# Patient Record
Sex: Female | Born: 1965 | Race: Black or African American | Hispanic: No | Marital: Single | Smoking: Never smoker
Health system: Southern US, Community
[De-identification: ages and names within clinical notes are randomized; demographics above are authoritative.]

## PROBLEM LIST (undated history)

## (undated) DIAGNOSIS — I10 Essential (primary) hypertension: Secondary | ICD-10-CM

## (undated) DIAGNOSIS — L659 Nonscarring hair loss, unspecified: Secondary | ICD-10-CM

## (undated) DIAGNOSIS — E119 Type 2 diabetes mellitus without complications: Secondary | ICD-10-CM

## (undated) DIAGNOSIS — D649 Anemia, unspecified: Secondary | ICD-10-CM

## (undated) DIAGNOSIS — F32A Depression, unspecified: Secondary | ICD-10-CM

## (undated) DIAGNOSIS — F329 Major depressive disorder, single episode, unspecified: Secondary | ICD-10-CM

## (undated) DIAGNOSIS — R000 Tachycardia, unspecified: Secondary | ICD-10-CM

## (undated) DIAGNOSIS — D126 Benign neoplasm of colon, unspecified: Secondary | ICD-10-CM

## (undated) HISTORY — DX: Nonscarring hair loss, unspecified: L65.9

## (undated) HISTORY — DX: Anemia, unspecified: D64.9

## (undated) HISTORY — DX: Depression, unspecified: F32.A

## (undated) HISTORY — DX: Type 2 diabetes mellitus without complications: E11.9

## (undated) HISTORY — DX: Essential (primary) hypertension: I10

## (undated) HISTORY — DX: Major depressive disorder, single episode, unspecified: F32.9

## (undated) HISTORY — PX: OTHER SURGICAL HISTORY: SHX169

## (undated) HISTORY — DX: Benign neoplasm of colon, unspecified: D12.6

## (undated) HISTORY — DX: Tachycardia, unspecified: R00.0

---

## 2000-10-29 ENCOUNTER — Emergency Department (HOSPITAL_COMMUNITY): Admission: EM | Admit: 2000-10-29 | Discharge: 2000-10-29 | Payer: Self-pay | Admitting: Emergency Medicine

## 2000-10-30 ENCOUNTER — Encounter: Payer: Self-pay | Admitting: Emergency Medicine

## 2001-11-08 ENCOUNTER — Encounter: Payer: Self-pay | Admitting: Obstetrics & Gynecology

## 2001-11-08 ENCOUNTER — Ambulatory Visit (HOSPITAL_COMMUNITY): Admission: RE | Admit: 2001-11-08 | Discharge: 2001-11-08 | Payer: Self-pay | Admitting: Obstetrics & Gynecology

## 2001-11-14 ENCOUNTER — Other Ambulatory Visit: Admission: RE | Admit: 2001-11-14 | Discharge: 2001-11-14 | Payer: Self-pay | Admitting: Obstetrics

## 2002-05-13 ENCOUNTER — Emergency Department (HOSPITAL_COMMUNITY): Admission: EM | Admit: 2002-05-13 | Discharge: 2002-05-14 | Payer: Self-pay | Admitting: Emergency Medicine

## 2003-06-27 ENCOUNTER — Emergency Department (HOSPITAL_COMMUNITY): Admission: EM | Admit: 2003-06-27 | Discharge: 2003-06-27 | Payer: Self-pay | Admitting: Emergency Medicine

## 2004-01-23 ENCOUNTER — Ambulatory Visit: Payer: Self-pay | Admitting: Psychiatry

## 2004-01-23 ENCOUNTER — Inpatient Hospital Stay (HOSPITAL_COMMUNITY): Admission: AD | Admit: 2004-01-23 | Discharge: 2004-01-29 | Payer: Self-pay | Admitting: Psychiatry

## 2005-06-25 ENCOUNTER — Emergency Department (HOSPITAL_COMMUNITY): Admission: EM | Admit: 2005-06-25 | Discharge: 2005-06-25 | Payer: Self-pay | Admitting: Emergency Medicine

## 2005-08-11 ENCOUNTER — Encounter: Admission: RE | Admit: 2005-08-11 | Discharge: 2005-09-16 | Payer: Self-pay | Admitting: Orthopedic Surgery

## 2005-09-19 ENCOUNTER — Ambulatory Visit: Payer: Self-pay | Admitting: Internal Medicine

## 2005-09-30 ENCOUNTER — Ambulatory Visit: Payer: Self-pay | Admitting: Internal Medicine

## 2005-10-07 ENCOUNTER — Ambulatory Visit: Payer: Self-pay | Admitting: Internal Medicine

## 2005-10-12 ENCOUNTER — Ambulatory Visit (HOSPITAL_COMMUNITY): Admission: RE | Admit: 2005-10-12 | Discharge: 2005-10-12 | Payer: Self-pay | Admitting: Internal Medicine

## 2005-12-13 ENCOUNTER — Ambulatory Visit: Payer: Self-pay | Admitting: Psychiatry

## 2005-12-13 ENCOUNTER — Inpatient Hospital Stay (HOSPITAL_COMMUNITY): Admission: AD | Admit: 2005-12-13 | Discharge: 2005-12-21 | Payer: Self-pay | Admitting: Psychiatry

## 2005-12-14 ENCOUNTER — Encounter: Payer: Self-pay | Admitting: Psychiatry

## 2006-05-03 ENCOUNTER — Ambulatory Visit: Payer: Self-pay | Admitting: Internal Medicine

## 2006-05-05 ENCOUNTER — Ambulatory Visit: Payer: Self-pay | Admitting: Internal Medicine

## 2006-05-11 ENCOUNTER — Ambulatory Visit: Payer: Self-pay | Admitting: *Deleted

## 2006-06-16 ENCOUNTER — Ambulatory Visit: Payer: Self-pay | Admitting: Internal Medicine

## 2006-06-29 ENCOUNTER — Ambulatory Visit: Payer: Self-pay | Admitting: Internal Medicine

## 2006-08-03 ENCOUNTER — Ambulatory Visit: Payer: Self-pay | Admitting: Internal Medicine

## 2007-01-24 ENCOUNTER — Encounter (INDEPENDENT_AMBULATORY_CARE_PROVIDER_SITE_OTHER): Payer: Self-pay | Admitting: *Deleted

## 2010-05-09 DIAGNOSIS — R000 Tachycardia, unspecified: Secondary | ICD-10-CM

## 2010-05-09 HISTORY — DX: Tachycardia, unspecified: R00.0

## 2010-09-24 NOTE — H&P (Signed)
NAMEPANDA, CROSSIN               ACCOUNT NO.:  [id number]   MEDICAL RECORD NO.:  [id number]          PATIENT TYPE:  IPS   LOCATION:  0504                          FACILITY:  [facility]   PHYSICIAN:  Geoffery Lyons, M.D.      DATE OF BIRTH:  08-Aug-1965   DATE OF ADMISSION:  01/23/2004  DATE OF DISCHARGE:                         PSYCHIATRIC ADMISSION ASSESSMENT   IDENTIFYING INFORMATION:  This is an involuntary admission.  This is a 45-  year-old single African-American female.  She apparently attempted suicide  by overdosing on several medications, Lexapro 1 pill, trazodone 5, Risperdal  2 pills.  She wanted to sleep and not wake up.  She reports she is  depressed, she cannot handle stress.  She is known to hit herself.  She has  done this in the past in the head with a hammer.  She reports that her sleep  is poor and her appetite is poor.  She is having trouble with her memory.  She currently denies any suicidal or homicidal ideation and no auditory or  visual hallucinations.  She is known to have mild MR.  She has had one prior  admission in 2004.   PAST PSYCHIATRIC HISTORY:  As already stated, she has had one prior  admission in 2004.  She is currently under the care of Dr. Vilinda Blanks at  [facility].   SOCIAL HISTORY:  She states she graduated high school in 1985.  She was  employed at [employer] for 4 years.  She has never married.  She has  no children.  She is currently living alone.  She is applying for  disability.  She has no income at the moment.   FAMILY HISTORY:  She denies.   ALCOHOL AND DRUG ABUSE:  She denies.   PAST MEDICAL HISTORY:  Primary care Gaylen Venning:  She has none at the moment.  It used to be Dr. Ronne Binning.  He treated her for hypertension but he is no  longer her physician.   CURRENT MEDICATIONS:  Norvasc 10 mg p.o. daily, Lexapro 10 mg daily, and  Seroquel 600 mg at h.s.   ALLERGIES:  No known drug allergies.   POSITIVE PHYSICAL  FINDINGS:  PHYSICAL EXAMINATION:  The patient is obese.  She has hirsutism.  Otherwise her physical examination was within normal  limits.   MENTAL STATUS EXAM:  She is alert and oriented x2.  She got the year wrong  twice.  First she thought it was 43, then 81.  I told her no, it is  32.  Appearance and behavior:  She has a wig on.  She has lots of facial  hair and other than that her appearance and behavior were within normal  limits.  Specifically, she is not neurovegetative.  She has a speech  impediment, otherwise her speech was normal in rate, rhythm and tone.  Her  mood is somewhat depressed.  Her affect is worried.  Her thought processes  are clear and relevant, goal oriented.  She would like help.  She did not  have any delusions or paranoia apparent  in her interview.  Concentration and  memory are fair, judgment and insight are fair.  Intelligence is average to  below.  She denies suicidal or homicidal ideation and she denies auditory or  visual hallucinations.   ADMISSION DIAGNOSES:   AXIS I:  Major depressive disorder, recurrent, without psychotic features.   AXIS II:  Deferred.   AXIS III:  She is known to have a cyst on her ovaries.  She has been treated  for fibroids in the past, asthma.  Her admission lab work indicated anemia  and hyperglycemia.  She probably has metabolic syndrome.   AXIS V:  Global assessment of function is 30.   PLAN:  The plan is to admit to provide stabilization and safety.  We will  address her psychotropics for appropriate dosages and types.  We will  consult with [facility] as she is unassigned regarding her  various medical illness.      MD/MEDQ  D:  01/24/2004  T:  01/25/2004  Job:  [id number]

## 2010-09-24 NOTE — Discharge Summary (Signed)
Lauren Mccormick, Lauren Mccormick               ACCOUNT NO.:  [id number]   MEDICAL RECORD NO.:  [id number]          PATIENT TYPE:  IPS   LOCATION:  0504                          FACILITY:  [facility]   PHYSICIAN:  Geoffery Lyons, M.D.      DATE OF BIRTH:  [birth year]   DATE OF ADMISSION:  01/23/2004  DATE OF DISCHARGE:  01/29/2004                                 DISCHARGE SUMMARY   CHIEF COMPLAINT AND PRESENT ILLNESS:  It was the first admission to [facility] for this 45 year old, single, African-American  female, attempted suicide by overdosing on several medications - Lexapro,  trazodone and Risperdal.  She claims she wanted to sleep and not wake up.  Reported that she was depressed, can not handle stress.  Known to hit  herself.  She has done this in the past in the head with a hammer.  She  reported that her sleep was poor and her appetite was poor.  Trouble with  her memory.  Currently denied any suicidal or homicidal ideation.  No  auditory, visual hallucinations.  She actually had one prior admission in  2004.   PAST PSYCHIATRIC HISTORY:  Currently under the care of Dr. __________ at  [facility].   ALCOHOL/DRUG HISTORY:  Denies the use or abuse of any substance.   PAST MEDICAL HISTORY:  Has been treated for hypertension.   MEDICATIONS:  1.  Norvasc 10 mg per day.  2.  Lexapro 10 mg per day.  3.  Seroquel 600 at bedtime.   PHYSICAL EXAMINATION:  Performed and failed to show any acute findings.   LABORATORY WORKUP:  CBC within normal limits, except hemoglobin 10.4.  Hemoglobin A1c is 5.7.  TSH 1.041.  Blood chemistries within normal limits.  Drug screen negative for substances of abuse.   MENTAL STATUS EXAM:  Reveals an alert, cooperative female.  She has a speech  impediment, otherwise her speech was normal rate, rhythm and tone.  Mood was  somewhat depressed.  Affect was worried.  Thought processes were clear,  relevant, goal-oriented.  She would  like help.  Did not have any delusions  or paranoia apparent in the interview.  Cognition was well-preserved.  Intellectual abilities seemed to be average to below average.   ADMISSION DIAGNOSES:   AXIS I:  Major depression, recurrent.   AXIS II:  No diagnosis.   AXIS III:  1.  Anemia.  2.  Ovarian cysts.  3.  Hypertension.   AXIS IV:  Moderate.   AXIS V:  Global Assessment of Functioning upon admission 30; highest Global  Assessment of Functioning in the last year 60.   COURSE IN HOSPITAL:  She was admitted and started in individual and group  psychotherapy.  She was given Ambien for sleep.  She was maintained on the  Norvasc 10 mg per day, Lexapro 10 mg per day and Seroquel 600 at bedtime.  Lexapro was increased to 15 and she was placed on Abilify 5 mg per day.  Initially very depressed, isolated, felt under a lot of stress, especially  financial, some  underlying paranoia and difficulty with her mood, her mood  having been very depressed with suicide thoughts.  Initially on Prozac;  lately on Lexapro.  She endorsed difficulty with paranoia as well as  multiple stressors.  Continued to endorse that she was feeling very  depressed.  Not sleeping too well.  We went ahead and increased her Seroquel  to 700.  There are concerns having to do with her financial situation,  trying to get disability.  Endorsed some voices inside her head.  By  September 21st, she endorsed she was starting to feel better.  Felt like her  thinking was better.  Still very anxious, having a hard time talking in  groups.  Keeping to herself.  Very anxious about being in group, but  challenging herself to try to do better.  Objectively, she was a little  brighter, but she was denying any suicide, any homicide ideations.  On  September 22nd, she was feeling like she was ready to go home and there were  no suicide ideas, no homicide ideas.  Anxious about getting out, but felt  strongly that she could do it.   Feeling that she needed to get a job, but at  the same time not sure she was going to be able to do it.  The family was  supportive and she was at this time committed to maintain outpatient  treatment.  Upon discharge, in full contact with reality.  No suicide ideas,  no homicide ideas, no hallucinations, no delusions.  __________ to pursue  further treatment.   DISCHARGE DIAGNOSES:   AXIS I:  1.  Major depression with psychotic features.  2.  Anxiety, not otherwise specified.   AXIS II:  No diagnosis.   AXIS III:  1.  Anemia.  2.  Hypertension.  3.  Ovarian cysts.   AXIS IV:  Moderate.   AXIS V:  Global Assessment of Functioning upon discharge 50.   DISCHARGE MEDICATIONS:  1.  Norvasc 10 mg per day.  2.  Abilify 5 mg one in the morning.  3.  Lexapro 15 mg per day.  4.  Iron, ferrous sulfate 325 one twice a day.  5.  Seroquel 800 at bedtime.   FOLLOW UP:  At [facility].     Farrel Gordon   IL/MEDQ  D:  02/18/2004  T:  02/19/2004  Job:  [id number]

## 2013-05-22 ENCOUNTER — Encounter: Payer: Self-pay | Admitting: Physician Assistant

## 2013-05-27 ENCOUNTER — Ambulatory Visit: Payer: Self-pay | Admitting: Physician Assistant

## 2013-06-13 ENCOUNTER — Ambulatory Visit: Payer: Medicare HMO

## 2013-06-13 ENCOUNTER — Ambulatory Visit (INDEPENDENT_AMBULATORY_CARE_PROVIDER_SITE_OTHER): Payer: Medicare HMO | Admitting: Family Medicine

## 2013-06-13 VITALS — BP 142/86 | HR 92 | Temp 98.0°F | Resp 16 | Ht 62.0 in | Wt 257.0 lb

## 2013-06-13 DIAGNOSIS — R059 Cough, unspecified: Secondary | ICD-10-CM

## 2013-06-13 DIAGNOSIS — R05 Cough: Secondary | ICD-10-CM

## 2013-06-13 DIAGNOSIS — R5381 Other malaise: Secondary | ICD-10-CM

## 2013-06-13 DIAGNOSIS — R0602 Shortness of breath: Secondary | ICD-10-CM

## 2013-06-13 DIAGNOSIS — Z8679 Personal history of other diseases of the circulatory system: Secondary | ICD-10-CM

## 2013-06-13 DIAGNOSIS — R350 Frequency of micturition: Secondary | ICD-10-CM

## 2013-06-13 DIAGNOSIS — R5383 Other fatigue: Secondary | ICD-10-CM

## 2013-06-13 DIAGNOSIS — E1149 Type 2 diabetes mellitus with other diabetic neurological complication: Secondary | ICD-10-CM

## 2013-06-13 DIAGNOSIS — I10 Essential (primary) hypertension: Secondary | ICD-10-CM

## 2013-06-13 LAB — POCT URINALYSIS DIPSTICK
Bilirubin, UA: NEGATIVE
Blood, UA: NEGATIVE
Glucose, UA: NEGATIVE
Ketones, UA: NEGATIVE
Nitrite, UA: NEGATIVE
Protein, UA: NEGATIVE
Spec Grav, UA: 1.015
Urobilinogen, UA: 0.2
pH, UA: 7

## 2013-06-13 LAB — POCT CBC
Granulocyte percent: 62.5 %G (ref 37–80)
HCT, POC: 36.6 % — AB (ref 37.7–47.9)
Hemoglobin: 11.3 g/dL — AB (ref 12.2–16.2)
Lymph, poc: 2.6 (ref 0.6–3.4)
MCH, POC: 27.4 pg (ref 27–31.2)
MCHC: 30.9 g/dL — AB (ref 31.8–35.4)
MCV: 88.9 fL (ref 80–97)
MID (cbc): 0.4 (ref 0–0.9)
MPV: 8 fL (ref 0–99.8)
POC Granulocyte: 5.1 (ref 2–6.9)
POC LYMPH PERCENT: 32.5 %L (ref 10–50)
POC MID %: 5 %M (ref 0–12)
Platelet Count, POC: 340 10*3/uL (ref 142–424)
RBC: 4.12 M/uL (ref 4.04–5.48)
RDW, POC: 14.9 %
WBC: 8.1 10*3/uL (ref 4.6–10.2)

## 2013-06-13 LAB — POCT UA - MICROSCOPIC ONLY
Casts, Ur, LPF, POC: NEGATIVE
Crystals, Ur, HPF, POC: NEGATIVE
Mucus, UA: NEGATIVE
Yeast, UA: NEGATIVE

## 2013-06-13 LAB — POCT GLYCOSYLATED HEMOGLOBIN (HGB A1C): Hemoglobin A1C: 5.9

## 2013-06-13 NOTE — Patient Instructions (Signed)
Cough, Adult  A cough is a reflex that helps clear your throat and airways. It can help heal the body or may be a reaction to an irritated airway. A cough may only last 2 or 3 weeks (acute) or may last more than 8 weeks (chronic).  CAUSES Acute cough:  Viral or bacterial infections. Chronic cough:  Infections.  Allergies.  Asthma.  Post-nasal drip.  Smoking.  Heartburn or acid reflux.  Some medicines.  Chronic lung problems (COPD).  Cancer. SYMPTOMS   Cough.  Fever.  Chest pain.  Increased breathing rate.  High-pitched whistling sound when breathing (wheezing).  Colored mucus that you cough up (sputum). TREATMENT   A bacterial cough may be treated with antibiotic medicine.  A viral cough must run its course and will not respond to antibiotics.  Your caregiver may recommend other treatments if you have a chronic cough. HOME CARE INSTRUCTIONS   Only take over-the-counter or prescription medicines for pain, discomfort, or fever as directed by your caregiver. Use cough suppressants only as directed by your caregiver.  Use a cold steam vaporizer or humidifier in your bedroom or home to help loosen secretions.  Sleep in a semi-upright position if your cough is worse at night.  Rest as needed.  Stop smoking if you smoke. SEEK IMMEDIATE MEDICAL CARE IF:   You have pus in your sputum.  Your cough starts to worsen.  You cannot control your cough with suppressants and are losing sleep.  You begin coughing up blood.  You have difficulty breathing.  You develop pain which is getting worse or is uncontrolled with medicine.  You have a fever. MAKE SURE YOU:   Understand these instructions.  Will watch your condition.  Will get help right away if you are not doing well or get worse. Document Released: 10/22/2010 Document Revised: 07/18/2011 Document Reviewed: 10/22/2010 [facility] Patient Information 2014 [place]. Shortness of Breath Shortness of  breath means you have trouble breathing. Shortness of breath needs medical care right away. HOME CARE   Do not smoke.  Avoid being around chemicals or things (paint fumes, dust) that may bother your breathing.  Rest as needed. Slowly begin your normal activities.  Only take medicines as told by your doctor.  Keep all doctor visits as told. GET HELP RIGHT AWAY IF:   Your shortness of breath gets worse.  You feel lightheaded, pass out (faint), or have a cough that is not helped by medicine.  You cough up blood.  You have pain with breathing.  You have pain in your chest, arms, shoulders, or belly (abdomen).  You have a fever.  You cannot walk up stairs or exercise the way you normally do.  You do not get better in the time expected.  You have a hard time doing normal activities even with rest.  You have problems with your medicines.  You have any new symptoms. MAKE SURE YOU:  Understand these instructions.  Will watch your condition.  Will get help right away if you are not doing well or get worse. Document Released: 10/12/2007 Document Revised: 10/25/2011 Document Reviewed: 07/11/2011 [facility] Patient Information 2014 [place], [place].

## 2013-06-13 NOTE — Progress Notes (Signed)
Chief Complaint:  Chief Complaint  Patient presents with  . Shortness of Breath    X "a very long time"  . Cough    X 2 years, says at night  . Chest Congestion    X 2 years, says at night    HPI: Lauren Mccormick is a 48 y.o. female who is here for a 2 year history of  " has been suffering from weakness and SOB" when she gets up in Am and bending she is constantly out of breath, experiences this more at at night when she has coughign and mucus from her chest. She feels tired and weak when she is bending. She gets "breathy". Again this is mores so with bending rather than exertion. Symptoms have been present for 2-3 years. Has seen a previosu doctor in  and was found to have an enlarged heart, ran tests on her (EKG, Echocradiogram, and saw a cardiologist in 2010 and did not find anything at the time . Denies seasonal allergies or asthma. Denies CHF.   She weighed 280 lbs but since she has been down here she has lost weight so she does not think SOB is related to her weight. She has minimal LE edema, uses 2 pillows, denies daytime sleepiness or apnea. She does not know if she snores. She has never been tested fro OSA. She is not SOB when she walks across the room.   She has diabetes but has not been checked since moved in [place] and has seen  2 doctors. Too long of a stay so she left. She was checked for diabetes 1 month. She was checked for DM  Has tried otc nasal spray without releif gor allergy sxs  Past Medical History  Diagnosis Date  . Anemia   . Depression   . Diabetes mellitus without complication   . Hypertension    History reviewed. No pertinent past surgical history. History   Social History  . Marital Status: Single    Spouse Name: N/A    Number of Children: N/A  . Years of Education: N/A   Social History Main Topics  . Smoking status: Never Smoker   . Smokeless tobacco: None  . Alcohol Use: No  . Drug Use: No  . Sexual Activity: None   Other  Topics Concern  . None   Social History Narrative  . None   Family History  Problem Relation Age of Onset  . Hypertension Mother   . Heart disease Father   . Hypertension Father   . Stroke Father    Allergies no known allergies Prior to Admission medications   Medication Sig Start Date End Date Taking? Authorizing Provider  escitalopram (LEXAPRO) 20 MG tablet Take 20 mg by mouth daily.   Yes Historical Provider, MD  famotidine (PEPCID) 20 MG tablet Take 20 mg by mouth 2 (two) times daily.   Yes Historical Provider, MD  gabapentin (NEURONTIN) 300 MG capsule Take 300 mg by mouth 3 (three) times daily.   Yes Historical Provider, MD  lisinopril (PRINIVIL,ZESTRIL) 20 MG tablet Take 20 mg by mouth daily.   Yes Historical Provider, MD  sitaGLIPtin (JANUVIA) 100 MG tablet Take 100 mg by mouth daily.   Yes Historical Provider, MD     ROS: The patient denies fevers, chills, night sweats, unintentional weight loss, chest pain, palpitations, wheezing, nausea, vomiting, abdominal pain, dysuria, hematuria, melena, numbness, weakness, or tingling.   All other systems have been reviewed and  were otherwise negative with the exception of those mentioned in the HPI and as above.    PHYSICAL EXAM: Filed Vitals:   06/13/13 1715  BP: 142/86  Pulse: 92  Temp: 98 F (36.7 C)  Resp: 16  Spo2 100% Filed Vitals:   06/13/13 1715  Height: 5\' 2"  (1.575 m)  Weight: 257 lb (116.574 kg)   Body mass index is 46.99 kg/(m^2).  General: Alert, no acute distress, obese HEENT:  Normocephalic, atraumatic, oropharynx patent. EOMI, PERRLA Cardiovascular:  Regular rate and rhythm, no rubs murmurs or gallops.  No Carotid bruits, radial pulse intact. No pedal edema.  Respiratory: Clear to auscultation bilaterally.  No wheezes, rales, or rhonchi.  No cyanosis, no use of accessory musculature GI: No organomegaly, abdomen is soft and non-tender, positive bowel sounds.  No masses. Skin: No rashes. Neurologic:  Facial musculature symmetric. Psychiatric: Patient is appropriate throughout our interaction. Lymphatic: No cervical lymphadenopathy Musculoskeletal: Gait intact. NO CVA tenderness   LABS: Results for orders placed in visit on 06/13/13  POCT CBC      Result Value Range   WBC 8.1  4.6 - 10.2 K/uL   Lymph, poc 2.6  0.6 - 3.4   POC LYMPH PERCENT 32.5  10 - 50 %L   MID (cbc) 0.4  0 - 0.9   POC MID % 5.0  0 - 12 %M   POC Granulocyte 5.1  2 - 6.9   Granulocyte percent 62.5  37 - 80 %G   RBC 4.12  4.04 - 5.48 M/uL   Hemoglobin 11.3 (*) 12.2 - 16.2 g/dL   HCT, POC 36.6 (*) 37.7 - 47.9 %   MCV 88.9  80 - 97 fL   MCH, POC 27.4  27 - 31.2 pg   MCHC 30.9 (*) 31.8 - 35.4 g/dL   RDW, POC 14.9     Platelet Count, POC 340  142 - 424 K/uL   MPV 8.0  0 - 99.8 fL  POCT GLYCOSYLATED HEMOGLOBIN (HGB A1C)      Result Value Range   Hemoglobin A1C 5.9    POCT UA - MICROSCOPIC ONLY      Result Value Range   WBC, Ur, HPF, POC 8-12     RBC, urine, microscopic 0-2     Bacteria, U Microscopic 1+     Mucus, UA neg     Epithelial cells, urine per micros 2-4     Crystals, Ur, HPF, POC neg     Casts, Ur, LPF, POC neg     Yeast, UA neg    POCT URINALYSIS DIPSTICK      Result Value Range   Color, UA yellow     Clarity, UA clear     Glucose, UA neg     Bilirubin, UA neg     Ketones, UA neg     Spec Grav, UA 1.015     Blood, UA neg     pH, UA 7.0     Protein, UA neg     Urobilinogen, UA 0.2     Nitrite, UA neg     Leukocytes, UA small (1+)       EKG/XRAY:   Primary read interpreted by Dr. Marin Comment at [facility]. ? Right lower Infiltrate vs increase bronchial thickening, slightly more prominent than in 2007 xray comparison + cardiomegaly stable   ASSESSMENT/PLAN: Encounter Diagnoses  Name Primary?  . SOB (shortness of breath) Yes  . Cough   . Other malaise and fatigue   .  Type II or unspecified type diabetes mellitus with neurological manifestations, not stated as uncontrolled   . HTN  (hypertension)   . Increased urinary frequency   . History of cardiomegaly     Viral  URI is causing symptoms But because cardiomegaly history I am going to refer to cardiology before we make any decisions about referring to pulmonology Will need old records, those are pending asked pt to get them.  Will treat sxs for now but will make  F/u prn   Gross sideeffects, risk and benefits, and alternatives of medications d/w patient. Patient is aware that all medications have potential sideeffects and we are unable to predict every sideeffect or drug-drug interaction that may occur.  LE, [place], DO 06/13/2013 7:51 PM

## 2013-06-14 LAB — COMPREHENSIVE METABOLIC PANEL
ALT: 14 U/L (ref 0–35)
AST: 21 U/L (ref 0–37)
Albumin: 4.1 g/dL (ref 3.5–5.2)
Alkaline Phosphatase: 70 U/L (ref 39–117)
Calcium: 9.7 mg/dL (ref 8.4–10.5)
Chloride: 102 mEq/L (ref 96–112)
Glucose, Bld: 77 mg/dL (ref 70–99)
Potassium: 4 mEq/L (ref 3.5–5.3)
Sodium: 139 mEq/L (ref 135–145)
Total Bilirubin: 0.3 mg/dL (ref 0.2–1.2)
Total Protein: 7.8 g/dL (ref 6.0–8.3)

## 2013-06-14 LAB — COMPREHENSIVE METABOLIC PANEL WITH GFR
BUN: 5 mg/dL — ABNORMAL LOW (ref 6–23)
CO2: 28 meq/L (ref 19–32)
Creat: 0.69 mg/dL (ref 0.50–1.10)

## 2013-06-14 LAB — TSH: TSH: 1.161 u[IU]/mL (ref 0.350–4.500)

## 2013-06-17 ENCOUNTER — Telehealth: Payer: Self-pay

## 2013-06-17 NOTE — Telephone Encounter (Signed)
Please call patient ASAP. Patient needs to give important information to medical staff regarding her previous visits and medical records at an facility out of state.     Thank You!!!

## 2013-06-17 NOTE — Telephone Encounter (Signed)
Spoke with patient and she needed to give information so we could get her medical records. She said she filled out a record release when she was here.  [place] PH# [phone]

## 2013-06-18 NOTE — Telephone Encounter (Signed)
calleed [facility] and got fax number. Tried faxing release form but it failed. Form refaxed and waiting for confirmation.

## 2013-06-19 NOTE — Telephone Encounter (Signed)
Tried faxing records request several times and they all failed. Requested mailed this morning to [facility] at [address] [place] [address]

## 2013-06-25 ENCOUNTER — Institutional Professional Consult (permissible substitution): Payer: Self-pay | Admitting: Cardiovascular Disease

## 2013-06-28 ENCOUNTER — Ambulatory Visit: Payer: Self-pay | Admitting: Physician Assistant

## 2013-06-28 ENCOUNTER — Ambulatory Visit: Payer: Medicare HMO | Admitting: Family Medicine

## 2013-07-04 ENCOUNTER — Institutional Professional Consult (permissible substitution): Payer: Self-pay | Admitting: Cardiovascular Disease

## 2013-07-10 ENCOUNTER — Institutional Professional Consult (permissible substitution): Payer: Self-pay | Admitting: Cardiovascular Disease

## 2013-08-12 ENCOUNTER — Encounter: Payer: Self-pay | Admitting: Cardiovascular Disease

## 2013-08-12 ENCOUNTER — Ambulatory Visit (INDEPENDENT_AMBULATORY_CARE_PROVIDER_SITE_OTHER): Payer: Commercial Managed Care - HMO | Admitting: Cardiovascular Disease

## 2013-08-12 VITALS — BP 138/90 | HR 104 | Ht 62.0 in | Wt 252.4 lb

## 2013-08-12 DIAGNOSIS — R0989 Other specified symptoms and signs involving the circulatory and respiratory systems: Secondary | ICD-10-CM

## 2013-08-12 DIAGNOSIS — R0600 Dyspnea, unspecified: Secondary | ICD-10-CM | POA: Insufficient documentation

## 2013-08-12 DIAGNOSIS — R0609 Other forms of dyspnea: Secondary | ICD-10-CM

## 2013-08-12 MED ORDER — CARVEDILOL 6.25 MG PO TABS: 6.2500 mg | ORAL_TABLET | Freq: Two times a day (BID) | ORAL | Status: DC

## 2013-08-12 NOTE — Progress Notes (Signed)
     Lauren Mccormick Date of Birth  Oct 07, 1965       [facility]    [employer] [address], [place], [place]  [address]   [place], [place]  [address] [place]   Fax  [phone]     Fax [phone]  Problem List: 1. Hypertension 2. Diabetes mellitus 3. Dyspnea  History of Present Illness:  Lauren Mccormick is a 48 yo who has dyspnea - especially when bending over.  No CP.  She is short of breath at rest.  She does not get any exercise.  She complains of generalized weakness.   She is on disability.    She has been found to have cardiomegaly.   Current Outpatient Prescriptions on File Prior to Visit  Medication Sig Dispense Refill  . escitalopram (LEXAPRO) 20 MG tablet Take 20 mg by mouth daily.      . famotidine (PEPCID) 20 MG tablet Take 20 mg by mouth 2 (two) times daily.      Marland Kitchen gabapentin (NEURONTIN) 300 MG capsule Take 300 mg by mouth 3 (three) times daily.      Marland Kitchen lisinopril (PRINIVIL,ZESTRIL) 20 MG tablet Take 20 mg by mouth daily.      . sitaGLIPtin (JANUVIA) 100 MG tablet Take 100 mg by mouth daily.       No current facility-administered medications on file prior to visit.    No Known Allergies  Past Medical History  Diagnosis Date  . Anemia   . Depression   . Diabetes mellitus without complication   . Hypertension     No past surgical history on file.  History  Smoking status  . Never Smoker   Smokeless tobacco  . Not on file    History  Alcohol Use No    Family History  Problem Relation Age of Onset  . Hypertension Mother   . Heart disease Father   . Hypertension Father   . Stroke Father     Reviw of Systems:  Reviewed in the HPI.  All other systems are negative.  Physical Exam: Blood pressure 138/90, pulse 104, height 5\' 2"  (1.575 m), weight 252 lb 6.4 oz (114.488 kg). Wt Readings from Last 3 Encounters:  08/12/13 252 lb 6.4 oz (114.488 kg)  06/13/13 257 lb (116.574 kg)       General: Well developed, well nourished, in no acute distress.  Head: Normocephalic, atraumatic, sclera non-icteric, mucus membranes are moist,   Neck: Supple. Carotids are 2 + without bruits. No JVD   Lungs: Clear   Heart: RR, normal S1-S2  Abdomen: Soft, non-tender, non-distended with normal bowel sounds.  Msk:  Strength and tone are normal   Extremities: No clubbing or cyanosis. No edema.  Distal pedal pulses are 2+ and equal    Neuro: CN II - XII intact.  Alert and oriented X 3.   Psych:  Normal   ECG: Feb. 2015:  NSR. Normal ECG  Assessment / Plan:

## 2013-08-12 NOTE — Patient Instructions (Addendum)
Your physician has requested that you have an echocardiogram. Echocardiography is a painless test that uses sound waves to create images of your heart. It provides your doctor with information about the size and shape of your heart and how well your heart's chambers and valves are working. This procedure takes approximately one hour. There are no restrictions for this procedure.  Your physician has recommended you make the following change in your medication:  START CARVEDILOL/ COREG 6.25 MG TWICE DAILY 12 HOURS APART  Your physician recommends that you schedule a follow-up appointment in: 3 MONTHS   REDUCE [place], GRAVY, SAUCES, READY PREPARED FOODS [place]; LEAN CUISINE, LASAGNA. BACON, SAUSAGE, LUNCH MEAT, FAST FOODS, HOT DOGS, CHIPS, PIZZA, CHINESE FOOD, MEXICAN FOOD, SOY SAUCE, STORE BOUGHT FRIED CHICKEN= [place] FRIED CHICKEN/ BO JANGLES.  PICKLES, OLIVES, KETCHUP   [facility] Low Glycemic Diet (Source: [facility], 2006) Low Glycemic Foods (20-49) (Decrease risk of developing heart disease) Breakfast Cereals: All-Bran All-Bran Fruit 'n Oats Fiber One Oatmeal (not instant) Oat bran Fruits and fruit juices: (Limit to 1-2 servings per day) Apples Apricots (fresh & dried) Blackberries Blueberries Cherries Cranberries Peaches Pears Plums Prunes Grapefruit Raspberries Strawberries Tangerine Apple juice Grapefruit juice Tomato juice Beans and legumes (fresh-cooked): Black-eyed peas Butter beans Chick peas Lentils  Green beans Lima beans Kidney beans Navy beans Pinto beans Snow peas Non-starchy vegetables: Asparagus, avocado, broccoli, cabbage, cauliflower, celery, cucumber, greens, lettuce, mushrooms, peppers, tomatoes, okra, onions, spinach, summer squash Grains: Barley Bulgur Rye Wild rice Nuts and oils : Almonds Peanuts Sunflower seeds Hazelnuts Pecans Walnuts Oils that are liquid at room temperature Dairy,  fish, meat, soy, and eggs: Milk, skim Lowfat cheese Yogurt, lowfat, fruit sugar sweetened Lean red meat Fish  Skinless chicken & [place] Shellfish Egg whites (up to 3 daily) Soy products  Egg yolks (up to 7 or _____ per week) Moderate Glycemic Foods (50-69) Breakfast Cereals: Bran Buds Bran Chex Just Right Mini-Wheats  Special K Swiss muesli Fruits: Banana (under-ripe) Dates Figs Grapes Kiwi Mango Oranges Raisins Fruit Juices: Cranberry juice Orange juice Beans and legumes: [place]-type baked beans Canned pinto, kidney, or navy beans Green peas Vegetables: Beets Carrots  Sweet potato Yam Corn on the cob Breads: Pita (pocket) bread Oat bran bread Pumpernickel bread Rye bread Wheat bread, high fiber  Grains: Cornmeal Rice, brown Rice, white Couscous Pasta: Macaroni Pizza, cheese Ravioli, meat filled Spaghetti, white  Nuts: Cashews Macadamia Snacks: Chocolate Ice cream, lowfat Muffin Popcorn High Glycemic Foods (70-100)  Breakfast Cereals: Cheerios Corn Chex Corn Flakes Cream of Wheat Grape Nuts Grape Nut Flakes Grits Nutri-Grain Puffed Rice Puffed Wheat Rice Chex Rice Krispies Shredded Wheat Team Total Fruits: Pineapple Watermelon Banana (over-ripe) Beverages: Sodas, sweet tea, pineapple juice Vegetables: Potato, baked, boiled, fried, mashed [place] fries Canned or frozen corn Parsnips Winter squash Breads: Most breads (white and whole grain) Bagels Bread sticks Bread stuffing Kaiser roll Dinner rolls Grains: Rice, instant Tapioca, with milk Candy and most cookies Snacks: Donuts Corn chips Jelly beans Pretzels Pastries

## 2013-08-12 NOTE — Assessment & Plan Note (Signed)
We had long discussion about her morbid obesity. She definitely needs to lose weight. I suspect that this is the main cause of her shortness breath. It is definitely the cause of her shortness breath when she bends over.  We discussed various strategies of weight loss including going to Weight Watchers and carbohydrate reduction. I've given her the due to low glycemic index diet. She'll followup with her medical Dr. for further weight loss recommendations.

## 2013-08-12 NOTE — Assessment & Plan Note (Signed)
Lauren Mccormick has  had shortness of breath for years. She's been seen by several cardiologists in her previous home state of [place].  She's been completely evaluated noninvasively and her heart as always checked out okay. She told that she has had echocardiograms and stress test. She does not recall her having a cardiac cath.  She presents with shortness breath and weakness. She's particularly short of breath when she bends over. She also notes profound weakness whenever she tries to walk anywhere.  I suspect her shortness of breath is largely due to her obesity. We had long discussion about weight reduction strategies. I've given her a low carbohydrate diet. I've encouraged her to join Weight Watchers.  She eats  [place] toast almost every morning.   She eats potatoes quite frequently. She eats sausage and  bacon frequently.  She does not exercise because she is so fatigued.   She is tachycardic today and a suspected she may have some degree of diastolic dysfunction. We will start her on carvedilol 6.25 mg twice a day. If she does not tolerate that then we'll substitute diltiazem assuming that her left ventricular systolic function is normal.  I'll see her in 3 months for followup visit.  Following that we will likely refer her back to her general medical Dr for follow up.

## 2013-08-23 ENCOUNTER — Ambulatory Visit (HOSPITAL_COMMUNITY): Payer: Medicare HMO | Attending: Cardiology | Admitting: Radiology

## 2013-08-23 DIAGNOSIS — R0609 Other forms of dyspnea: Secondary | ICD-10-CM | POA: Diagnosis present

## 2013-08-23 DIAGNOSIS — R0989 Other specified symptoms and signs involving the circulatory and respiratory systems: Secondary | ICD-10-CM | POA: Diagnosis not present

## 2013-08-23 DIAGNOSIS — R0602 Shortness of breath: Secondary | ICD-10-CM

## 2013-08-23 DIAGNOSIS — R0600 Dyspnea, unspecified: Secondary | ICD-10-CM

## 2013-08-23 NOTE — Progress Notes (Signed)
Echocardiogram Performed. 

## 2013-10-05 ENCOUNTER — Encounter (HOSPITAL_COMMUNITY): Payer: Self-pay | Admitting: *Deleted

## 2013-10-05 ENCOUNTER — Inpatient Hospital Stay (HOSPITAL_COMMUNITY)
Admission: AD | Admit: 2013-10-05 | Discharge: 2013-10-11 | DRG: 885 | Disposition: A | Discharge status: Home / Self Care | Payer: Medicare HMO | Attending: Psychiatry | Admitting: Psychiatry

## 2013-10-05 ENCOUNTER — Encounter (HOSPITAL_COMMUNITY): Payer: Self-pay | Admitting: Emergency Medicine

## 2013-10-05 ENCOUNTER — Emergency Department (EMERGENCY_DEPARTMENT_HOSPITAL)
Admission: EM | Admit: 2013-10-05 | Discharge: 2013-10-05 | Disposition: A | Discharge status: Other Acute Inpatient Hospital | Payer: Medicare HMO | Source: Home / Self Care | Attending: Emergency Medicine | Admitting: Emergency Medicine

## 2013-10-05 DIAGNOSIS — Z8249 Family history of ischemic heart disease and other diseases of the circulatory system: Secondary | ICD-10-CM | POA: Diagnosis not present

## 2013-10-05 DIAGNOSIS — F4010 Social phobia, unspecified: Secondary | ICD-10-CM

## 2013-10-05 DIAGNOSIS — E119 Type 2 diabetes mellitus without complications: Secondary | ICD-10-CM | POA: Insufficient documentation

## 2013-10-05 DIAGNOSIS — Z823 Family history of stroke: Secondary | ICD-10-CM | POA: Diagnosis not present

## 2013-10-05 DIAGNOSIS — K219 Gastro-esophageal reflux disease without esophagitis: Secondary | ICD-10-CM | POA: Diagnosis present

## 2013-10-05 DIAGNOSIS — Z79899 Other long term (current) drug therapy: Secondary | ICD-10-CM | POA: Insufficient documentation

## 2013-10-05 DIAGNOSIS — F329 Major depressive disorder, single episode, unspecified: Secondary | ICD-10-CM | POA: Insufficient documentation

## 2013-10-05 DIAGNOSIS — Z862 Personal history of diseases of the blood and blood-forming organs and certain disorders involving the immune mechanism: Secondary | ICD-10-CM | POA: Insufficient documentation

## 2013-10-05 DIAGNOSIS — F79 Unspecified intellectual disabilities: Secondary | ICD-10-CM | POA: Diagnosis present

## 2013-10-05 DIAGNOSIS — I10 Essential (primary) hypertension: Secondary | ICD-10-CM | POA: Insufficient documentation

## 2013-10-05 DIAGNOSIS — R45851 Suicidal ideations: Secondary | ICD-10-CM

## 2013-10-05 DIAGNOSIS — F332 Major depressive disorder, recurrent severe without psychotic features: Secondary | ICD-10-CM

## 2013-10-05 DIAGNOSIS — G4700 Insomnia, unspecified: Secondary | ICD-10-CM | POA: Diagnosis present

## 2013-10-05 DIAGNOSIS — F411 Generalized anxiety disorder: Secondary | ICD-10-CM | POA: Diagnosis present

## 2013-10-05 LAB — RAPID URINE DRUG SCREEN, HOSP PERFORMED
Amphetamines: NOT DETECTED
BARBITURATES: NOT DETECTED
Benzodiazepines: NOT DETECTED
Cocaine: NOT DETECTED
Opiates: NOT DETECTED
Tetrahydrocannabinol: NOT DETECTED

## 2013-10-05 LAB — CBC
HEMATOCRIT: 37.5 % (ref 36.0–46.0)
Hemoglobin: 12.6 g/dL (ref 12.0–15.0)
MCH: 28.1 pg (ref 26.0–34.0)
MCHC: 33.6 g/dL (ref 30.0–36.0)
MCV: 83.5 fL (ref 78.0–100.0)
Platelets: 357 10*3/uL (ref 150–400)
RBC: 4.49 MIL/uL (ref 3.87–5.11)
RDW: 13.7 % (ref 11.5–15.5)
WBC: 9.2 10*3/uL (ref 4.0–10.5)

## 2013-10-05 LAB — COMPREHENSIVE METABOLIC PANEL
ALK PHOS: 72 U/L (ref 39–117)
ALT: 12 U/L (ref 0–35)
AST: 17 U/L (ref 0–37)
Albumin: 3.8 g/dL (ref 3.5–5.2)
BUN: 6 mg/dL (ref 6–23)
CHLORIDE: 102 meq/L (ref 96–112)
CO2: 26 mEq/L (ref 19–32)
CREATININE: 0.6 mg/dL (ref 0.50–1.10)
Calcium: 9.6 mg/dL (ref 8.4–10.5)
GFR calc Af Amer: >90 mL/min (ref >90)
Glucose, Bld: 114 mg/dL — ABNORMAL HIGH (ref 70–99)
Potassium: 4.1 mEq/L (ref 3.7–5.3)
Sodium: 138 mEq/L (ref 137–147)
Total Bilirubin: 0.4 mg/dL (ref 0.3–1.2)
Total Protein: 8 g/dL (ref 6.0–8.3)

## 2013-10-05 LAB — ETHANOL

## 2013-10-05 LAB — SALICYLATE LEVEL

## 2013-10-05 LAB — ACETAMINOPHEN LEVEL: Acetaminophen (Tylenol), Serum: <15 ug/mL (ref 10–30)

## 2013-10-05 MED ORDER — ZOLPIDEM TARTRATE 5 MG PO TABS: 5.0000 mg | ORAL_TABLET | Freq: Every evening | ORAL | Status: DC | PRN

## 2013-10-05 MED ORDER — ACETAMINOPHEN 325 MG PO TABS
650.0000 mg | ORAL_TABLET | Freq: Four times a day (QID) | ORAL | Status: DC | PRN
  Administered 2013-10-05: 650 mg via ORAL
  Filled 2013-10-05: qty 2

## 2013-10-05 MED ORDER — ALUM & MAG HYDROXIDE-SIMETH 200-200-20 MG/5ML PO SUSP: 30.0000 mL | ORAL | Status: DC | PRN

## 2013-10-05 MED ORDER — ONDANSETRON HCL 4 MG PO TABS
4.0000 mg | ORAL_TABLET | Freq: Three times a day (TID) | ORAL | Status: DC | PRN
Start: 2013-10-05 — End: 2013-10-05

## 2013-10-05 MED ORDER — LORAZEPAM 1 MG PO TABS: 1.0000 mg | ORAL_TABLET | Freq: Three times a day (TID) | ORAL | Status: DC | PRN

## 2013-10-05 MED ORDER — METFORMIN HCL 500 MG PO TABS
500.0000 mg | ORAL_TABLET | Freq: Every day | ORAL | Status: DC
  Administered 2013-10-06 – 2013-10-11 (×6): 500 mg via ORAL
  Filled 2013-10-05 (×9): qty 1

## 2013-10-05 MED ORDER — LINAGLIPTIN 5 MG PO TABS: 5.0000 mg | ORAL_TABLET | Freq: Every day | ORAL | Status: DC

## 2013-10-05 MED ORDER — CARVEDILOL 6.25 MG PO TABS
6.2500 mg | ORAL_TABLET | Freq: Two times a day (BID) | ORAL | Status: DC
  Administered 2013-10-05 – 2013-10-06 (×2): 6.25 mg via ORAL
  Administered 2013-10-06: 3.125 mg via ORAL
  Administered 2013-10-07 – 2013-10-11 (×9): 6.25 mg via ORAL
  Filled 2013-10-05 (×4): qty 1
  Filled 2013-10-05: qty 2
  Filled 2013-10-05 (×3): qty 1
  Filled 2013-10-05: qty 2
  Filled 2013-10-05 (×6): qty 1
  Filled 2013-10-05: qty 2
  Filled 2013-10-05 (×3): qty 1

## 2013-10-05 MED ORDER — FAMOTIDINE 20 MG PO TABS
20.0000 mg | ORAL_TABLET | Freq: Two times a day (BID) | ORAL | Status: DC
  Administered 2013-10-05 – 2013-10-11 (×12): 20 mg via ORAL
  Filled 2013-10-05 (×18): qty 1

## 2013-10-05 MED ORDER — ASPIRIN 81 MG PO CHEW
81.0000 mg | CHEWABLE_TABLET | Freq: Every day | ORAL | Status: DC
  Administered 2013-10-06 – 2013-10-11 (×6): 81 mg via ORAL
  Filled 2013-10-05 (×9): qty 1

## 2013-10-05 MED ORDER — LISINOPRIL 20 MG PO TABS
20.0000 mg | ORAL_TABLET | Freq: Every day | ORAL | Status: DC
  Administered 2013-10-06 – 2013-10-11 (×6): 20 mg via ORAL
  Filled 2013-10-05 (×3): qty 1
  Filled 2013-10-05: qty 4
  Filled 2013-10-05 (×5): qty 1

## 2013-10-05 MED ORDER — ESCITALOPRAM OXALATE 20 MG PO TABS
20.0000 mg | ORAL_TABLET | Freq: Every day | ORAL | Status: DC
  Administered 2013-10-06 – 2013-10-11 (×6): 20 mg via ORAL
  Filled 2013-10-05 (×7): qty 1
  Filled 2013-10-05: qty 4
  Filled 2013-10-05: qty 2

## 2013-10-05 MED ORDER — MAGNESIUM HYDROXIDE 400 MG/5ML PO SUSP: 30.0000 mL | Freq: Every day | ORAL | Status: DC | PRN

## 2013-10-05 MED ORDER — ADULT MULTIVITAMIN W/MINERALS CH
1.0000 | ORAL_TABLET | Freq: Every day | ORAL | Status: DC
  Administered 2013-10-06 – 2013-10-11 (×6): 1 via ORAL
  Filled 2013-10-05 (×9): qty 1

## 2013-10-05 MED ORDER — PNEUMOCOCCAL VAC POLYVALENT 25 MCG/0.5ML IJ INJ
0.5000 mL | INJECTION | INTRAMUSCULAR | Status: AC
  Administered 2013-10-06: 0.5 mL via INTRAMUSCULAR

## 2013-10-05 MED ORDER — LINAGLIPTIN 5 MG PO TABS
5.0000 mg | ORAL_TABLET | Freq: Every day | ORAL | Status: DC
Start: 2013-10-06 — End: 2013-10-11
  Administered 2013-10-06 – 2013-10-11 (×6): 5 mg via ORAL
  Filled 2013-10-05 (×9): qty 1

## 2013-10-05 MED ORDER — ACETAMINOPHEN 325 MG PO TABS: 650.0000 mg | ORAL_TABLET | ORAL | Status: DC | PRN

## 2013-10-05 MED ORDER — IBUPROFEN 200 MG PO TABS: 600.0000 mg | ORAL_TABLET | Freq: Three times a day (TID) | ORAL | Status: DC | PRN
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
## 2013-10-05 NOTE — ED Notes (Signed)
Pt BIB [facility], IVC.  IVC papers state: "Respondent presents with increased anxiety, depression, and suicidal thoughts.  Has attempted suicide three times in the past by overdosing on pills. Currently has a suicidal plan to jump into traffic.  Tearful and feeling without any hope.  Unable to contract for safety and is a danger to herself."  [facility] papers state: "This is a 48 y/o AA female who presents with increased anxiety, depression, and suicidal thoughts.  Planning to jump into traffic.  Has been dx w/ major depressive disorder and was prescribed lexapro and neurontin."

## 2013-10-05 NOTE — ED Notes (Signed)
Pt has been accepted at [facility].  They request that her IVC and first opinion forms be faxed to them.  They can take the pt after shift change.
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
## 2013-10-05 NOTE — ED Notes (Signed)
Report called to [place], Therapist, sports.  IVC papers, 1st opinion form, and papers from [facility] faxed to assessment team at [facility].  [phone]

## 2013-10-05 NOTE — ED Notes (Signed)
Pt has been updated on the plan.  Refused dinner.  No complaints at this time.

## 2013-10-05 NOTE — ED Notes (Signed)
[facility] called for pt transport to [facility] 

## 2013-10-06 LAB — GLUCOSE, CAPILLARY
GLUCOSE-CAPILLARY: 99 mg/dL (ref 70–99)
Glucose-Capillary: 110 mg/dL — ABNORMAL HIGH (ref 70–99)

## 2013-10-06 NOTE — [facility] Group Notes (Signed)
[facility] Group Notes:  (Nursing/MHT/Case Management/Adjunct)  Date:  10/06/2013  Time:  11:36 AM  Type of Therapy:  Psychoeducational Skills  Participation Level:  Did Not Attend   Lauren Mccormick 10/06/2013, 11:36 AM

## 2013-10-06 NOTE — [facility] Group Notes (Signed)
[facility] Group Notes:  (Nursing/MHT/Case Management/Adjunct)  Date:  10/06/2013  Time:  3:31 PM  Type of Therapy:  Psychoeducational Skills  Participation Level:  Did Not Attend  Lauren Mccormick 10/06/2013, 3:31 PM

## 2013-10-06 NOTE — Progress Notes (Signed)
Pt pleasant on approach, denies complaints at this time.  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  Denies SI/HI/hallucinations at this time.  Interacting appropriately with peers on unit.  A.  Support and encouragement offered  R.  Pt remains safe on unit, will continue to monitor.

## 2013-10-06 NOTE — Progress Notes (Signed)
D. Pt has been in room and in bed for much of the day today, choosing not to attend or participate in various milieu activities. Pt has spoken about how she is feeling depressed and did not feel like participating today. Pt did receive medications today without incident. A. Support and encouragement provided, medication education given. R. Pt verbalized understanding, safety maintained.

## 2013-10-06 NOTE — H&P (Signed)
  Pt was seen by me today and I agree with the key elements documented in H&P. Also see my SI assessment note for additional plan recommendations.

## 2013-10-06 NOTE — [facility] Group Notes (Signed)
[facility] Group Notes: (Clinical Social Work)   10/06/2013      Type of Therapy:  Group Therapy   Participation Level:  Did Not Attend    Selmer Dominion, LCSW 10/06/2013, 12:20 PM
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
## 2013-10-06 NOTE — [facility] Suicide Risk Assessment (Signed)
Suicide Risk Assessment  Suicide Risk Assessment Admission Assessment  Nursing information obtained from:  Patient  Demographic factors: ;Low socioeconomic status  Current Mental Status : NA  Loss Factors; disabled  Historical Factors : impulsive,  Risk Reduction Factors : Sense of responsibility to family  Total Time spent with patient : 20 minutes  CLINICAL FACTORS:  Alcohol/Substance Abuse/Dependencies  Psychiatric Specialty Exam: ?  ?  .   General Appearance: Casual   Eye Contact::  Poor   Speech:  Pressured   Volume:  Increased   Mood:  Depressed   Affect:  Blunt   Thought Process:  Goal Directed   Orientation:  Full (Time, Place, and Person)   Thought Content:  WDL   Suicidal Thoughts:  No   Homicidal Thoughts:  No   Memory:  NA   Judgement:  Poor   Insight:  Shallow   Psychomotor Activity:  Restlessness   Concentration:  Poor   Recall:  Poor   Fund of Knowledge: Poor   Language:  Poor   Akathisia:  No   Handed:  Right   AIMS (if indicated):   Assets:  Desire for Improvement   Sleep:   Musculoskeletal: Strength & Muscle Tone: within normal limits  Gait & Station: normal  Patient leans: Right  COGNITIVE FEATURES THAT CONTRIBUTE TO RISK:  Closed-mindedness  SUICIDE RISK:  Mild: Suicidal ideation of limited frequency, intensity, duration, and specificity. There are no identifiable plans, no associated intent, mild dysphoria and related symptoms, good self-control (both objective and subjective assessment), few other risk factors, and identifiable protective factors, including available and accessible social support.  PLAN OF CARE: ? Continue current meds,  I certify that inpatient services furnished can reasonably be expected to improve the patient's condition.

## 2013-10-07 DIAGNOSIS — F4010 Social phobia, unspecified: Secondary | ICD-10-CM

## 2013-10-07 LAB — GLUCOSE, CAPILLARY
GLUCOSE-CAPILLARY: 82 mg/dL (ref 70–99)
Glucose-Capillary: 143 mg/dL — ABNORMAL HIGH (ref 70–99)

## 2013-10-07 NOTE — Progress Notes (Signed)
Patient ID: Lauren Mccormick, female   DOB: December 09, 1965, 48 y.o.   MRN: [id number] Pt did not attend 10am Wellness group. Earlier when she was encouraged to attend Aftercare group, she began crying saying, "I don't like being with people. I just need to go home". Social Worker was informed.

## 2013-10-07 NOTE — Progress Notes (Signed)
Adult Psychoeducational Group Note  Date:  10/07/2013 Time:  8:00PM Group Topic/Focus:  Wrap-Up Group:   The focus of this group is to help patients review their daily goal of treatment and discuss progress on daily workbooks.  Participation Level:  Active  Participation Quality:  Appropriate and Attentive  Affect:  Appropriate  Cognitive:  Alert and Appropriate  Insight: Appropriate  Engagement in Group:  Engaged  Modes of Intervention:  Discussion  Additional Comments:   Pt. Attended AA/NA meeting.   Truman Hayward 10/07/2013, 10:31 PM
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
## 2013-10-07 NOTE — Progress Notes (Signed)
D:Pt rates her depression and hopelessness as a 10 on 1-10 scale with 10 being the most depressed. Pt presents with a flat/sad affect and minimal interaction. Pt has social anxiety when being with large crowds. Per order pt was allowed to have meals on the unit. Pt reports "I don't feel like talking about it" when encouraged to discuss feelings.  A:Offered support, encouragement and 15 minute checks.  R:Pt denies si and hi. Safety maintained on the unit.

## 2013-10-07 NOTE — [facility] Group Notes (Signed)
[facility] LCSW Aftercare Discharge Planning Group Note   10/07/2013  8:45 AM  Participation Quality:  Did Not Attend  Regan Lemming, LCSW 10/07/2013 9:56 AM

## 2013-10-07 NOTE — Progress Notes (Signed)
Patient ID: Lauren Mccormick, female   DOB: 12-10-1965, 48 y.o.   MRN: [id number]  D: Pt was observed in the dayroom interacting with a peer prior to the assessment. During the assessment pt stated "I was depressed but my momma made me more depressed. Hollering, screaming out of control".  Pt stated she moved from [place] to [place] in Sept 2014.  Stated she had been living in [place] for 4 to 5 yrs "in her own apt". However, pt stated that after getting sick her mom called and asked her to come back home. Stated her intentions was to find an apt, but that [place] checks credit scores and it has been difficult. Pt states she plans to return to [place] by July after she gets her check.  Pt plans to search for apts online while in [place], in hopes of having one waiting on her return.  Stated she hates that she argued with her mom, because she "never intended to have chaos in the home".   A:  Support and encouragement was offered. 15 min checks continued for safety.  R: Pt remains safe.

## 2013-10-07 NOTE — Progress Notes (Signed)
[facility] MD Progress Note  10/07/2013 4:59 PM Lauren Mccormick  MRN:  [id number] Subjective:  Lauren Mccormick states that it was a bad idea for her to come to [place]. States that he was doing well back in [place]. She came here and thought it was going to be easier to get a place on her own but it has taken longer she has been staying with her mother having a lot of conflict. She states she was kicked out of her mother's house. States she will have to stay at a shelter while she gets ready to move back next month. States she has a hard time being around people. Has a lot of anxiety when she is out in crowds. Tends to isolate Diagnosis:   DSM5: Schizophrenia Disorders:  none Obsessive-Compulsive Disorders:  none Trauma-Stressor Disorders:  none Substance/Addictive Disorders:  none Depressive Disorders:  Major Depressive Disorder - Moderate (296.22) Total Time spent with patient: 30 minutes  Axis I: Social Anxiety  ADL's:  Intact  Sleep: Fair  Appetite:  Fair  Suicidal Ideation:  Plan:  denies Intent:  denies Means:  denies Homicidal Ideation:  Plan:  denies Intent:  denies Means:  denies AEB (as evidenced by):  Psychiatric Specialty Exam: Physical Exam  Review of Systems  Constitutional: Negative.   HENT: Negative.   Eyes: Negative.   Respiratory: Negative.   Cardiovascular: Negative.   Gastrointestinal: Negative.   Genitourinary: Negative.   Musculoskeletal: Negative.   Skin: Negative.   Neurological: Negative.   Endo/Heme/Allergies: Negative.   Psychiatric/Behavioral: Positive for depression. The patient is nervous/anxious.     Blood pressure 117/89, pulse 111, temperature 98.9 F (37.2 C), temperature source Oral, resp. rate 16, height 5\' 2"  (2.952 m), weight 113.399 kg (250 lb).Body mass index is 45.71 kg/(m^2).  General Appearance: Fairly Groomed  Engineer, water::  Fair  Speech:  Clear and Coherent  Volume:  Decreased  Mood:  Anxious and sad, worried  Affect:   Restricted  Thought Process:  Coherent and Goal Directed  Orientation:  Full (Time, Place, and Person)  Thought Content:  symptoms, worries, concerns  Suicidal Thoughts:  No  Homicidal Thoughts:  No  Memory:  Immediate;   Fair Recent;   Fair Remote;   Fair  Judgement:  Fair  Insight:  Shallow  Psychomotor Activity:  Restlessness  Concentration:  Fair  Recall:  [employer] of Knowledge:NA  Language: Fair  Akathisia:  No  Handed:    AIMS (if indicated):     Assets:  Desire for Improvement  Sleep:  Number of Hours: 6   Musculoskeletal: Strength & Muscle Tone: within normal limits Gait & Station: normal Patient leans: N/A  Current Medications: Current Facility-Administered Medications  Medication Dose Route Frequency Provider Last Rate Last Dose  . acetaminophen (TYLENOL) tablet 650 mg  650 mg Oral Q6H PRN Lurena Nida, NP   650 mg at 10/05/13 2056  . alum & mag hydroxide-simeth (MAALOX/MYLANTA) 200-200-20 MG/5ML suspension 30 mL  30 mL Oral Q4H PRN Lurena Nida, NP      . aspirin chewable tablet 81 mg  81 mg Oral Daily Lurena Nida, NP   81 mg at 10/07/13 0839  . carvedilol (COREG) tablet 6.25 mg  6.25 mg Oral BID Lurena Nida, NP   6.25 mg at 10/07/13 0840  . escitalopram (LEXAPRO) tablet 20 mg  20 mg Oral Daily Lurena Nida, NP   20 mg at 10/07/13 0840  . famotidine (PEPCID) tablet 20  mg  20 mg Oral BID Lurena Nida, NP   20 mg at 10/07/13 0840  . linagliptin (TRADJENTA) tablet 5 mg  5 mg Oral Daily Lurena Nida, NP   5 mg at 10/07/13 0840  . lisinopril (PRINIVIL,ZESTRIL) tablet 20 mg  20 mg Oral Daily Lurena Nida, NP   20 mg at 10/07/13 0840  . magnesium hydroxide (MILK OF MAGNESIA) suspension 30 mL  30 mL Oral Daily PRN Lurena Nida, NP      . metFORMIN (GLUCOPHAGE) tablet 500 mg  500 mg Oral Q breakfast Lurena Nida, NP   500 mg at 10/07/13 0839  . multivitamin with minerals tablet 1 tablet  1 tablet Oral Daily Lurena Nida, NP   1 tablet at 10/07/13 0240     Lab Results:  Results for orders placed during the hospital encounter of 10/05/13 (from the past 48 hour(s))  GLUCOSE, CAPILLARY     Status: Abnormal   Collection Time    10/06/13  6:24 AM      Result Value Ref Range   Glucose-Capillary 110 (*) 70 - 99 mg/dL  GLUCOSE, CAPILLARY     Status: None   Collection Time    10/06/13  5:07 PM      Result Value Ref Range   Glucose-Capillary 99  70 - 99 mg/dL   Comment 1 Notify RN    GLUCOSE, CAPILLARY     Status: None   Collection Time    10/07/13  6:02 AM      Result Value Ref Range   Glucose-Capillary 82  70 - 99 mg/dL    Physical Findings: AIMS: Facial and Oral Movements Muscles of Facial Expression: None, normal Lips and Perioral Area: None, normal Jaw: None, normal Tongue: None, normal,Extremity Movements Upper (arms, wrists, hands, fingers): None, normal Lower (legs, knees, ankles, toes): None, normal, Trunk Movements Neck, shoulders, hips: None, normal, Overall Severity Severity of abnormal movements (highest score from questions above): None, normal Incapacitation due to abnormal movements: None, normal Patient's awareness of abnormal movements (rate only patient's report): No Awareness, Dental Status Current problems with teeth and/or dentures?: No Does patient usually wear dentures?: No  CIWA:    COWS:     Treatment Plan Summary: Daily contact with patient to assess and evaluate symptoms and progress in treatment Medication management  Plan: Supportive approach/coping skills           CBT;mindfulness           Optimize response to treatment  Medical Decision Making Problem Points:  Review of psycho-social stressors (1) Data Points:  Review of medication regiment & side effects (2) Review of new medications or change in dosage (2)  I certify that inpatient services furnished can reasonably be expected to improve the patient's condition.   Nicholaus Bloom 10/07/2013, 4:59 PM

## 2013-10-07 NOTE — Tx Team (Signed)
Interdisciplinary Treatment Plan Update (Adult)  Date: 10/07/2013  Time Reviewed:  9:45 AM  Progress in Treatment: Attending groups: Yes Participating in groups:  Yes Taking medication as prescribed:  Yes Tolerating medication:  Yes Family/Significant othe contact made: CSW assessing Patient understands diagnosis:  Yes Discussing patient identified problems/goals with staff:  Yes Medical problems stabilized or resolved:  Yes Denies suicidal/homicidal ideation: Yes Issues/concerns per patient self-inventory:  Yes Other:  New problem(s) identified: N/A  Discharge Plan or Barriers: CSW assessing for appropriate referrals.    Reason for Continuation of Hospitalization: Anxiety Depression Medication Stabilization  Comments: N/A  Estimated length of stay: 3-5 days  For review of initial/current patient goals, please see plan of care.  Attendees: Patient:     Family:     Physician:  Dr. Sabra Heck 10/07/2013 10:27 AM   Nursing:   Para March, RN 10/07/2013 10:27 AM   Clinical Social Worker:  Regan Lemming, LCSW 10/07/2013 10:27 AM   Other: Lars Pinks, UR case manager 10/07/2013 10:27 AM   Other:  Maxie Better, [place] 10/07/2013 10:27 AM   Other:  Agustina Caroli, NP 10/07/2013 10:27 AM   Other:  Satira Sark, RN 10/07/2013 10:28 AM   Other: Maureen Chatters, RN 10/07/2013 10:28 AM   Other:    Other:    Other:    Other:    Other:     Scribe for Treatment Team:   Ane Payment, 10/07/2013 , 10:27 AM

## 2013-10-07 NOTE — [facility] Suicide Risk Assessment (Signed)
[facility] Adult Inpatient Family/Significant Other Suicide Prevention Education  Suicide Prevention Education:   Patient Refusal for Family/Significant Other Suicide Prevention Education: The patient has refused to provide written consent for family/significant other to be provided Family/Significant Other Suicide Prevention Education during admission and/or prior to discharge.  Physician notified.  CSW provided suicide prevention information with patient.    The suicide prevention education provided includes the following:  Suicide risk factors  Suicide prevention and interventions  National Suicide Hotline telephone number  [facility] assessment telephone number  [facility] Emergency Assistance [place] and/or Residential Mobile Crisis Unit telephone number   Regan Lemming, [place] 10/07/2013 2:35 PM

## 2013-10-08 DIAGNOSIS — F4010 Social phobia, unspecified: Secondary | ICD-10-CM | POA: Diagnosis present

## 2013-10-08 DIAGNOSIS — F411 Generalized anxiety disorder: Secondary | ICD-10-CM | POA: Diagnosis present

## 2013-10-08 LAB — GLUCOSE, CAPILLARY
GLUCOSE-CAPILLARY: 133 mg/dL — AB (ref 70–99)
GLUCOSE-CAPILLARY: 100 mg/dL — AB (ref 70–99)
Glucose-Capillary: 149 mg/dL — ABNORMAL HIGH (ref 70–99)

## 2013-10-08 MED ORDER — QUETIAPINE FUMARATE 100 MG PO TABS
100.0000 mg | ORAL_TABLET | Freq: Every day | ORAL | Status: DC
  Administered 2013-10-08 – 2013-10-10 (×3): 100 mg via ORAL
  Filled 2013-10-08 (×5): qty 1
  Filled 2013-10-08: qty 4

## 2013-10-08 NOTE — Progress Notes (Signed)
Adult Psychoeducational Group Note  Date:  10/08/2013 Time:  10:22 AM  Group Topic/Focus:  Orientation:   The focus of this group is to educate the patient on the purpose and policies of crisis stabilization and provide a format to answer questions about their admission.  The group details unit policies and expectations of patients while admitted.  Participation Level:  Did Not Attend  Participation Quality:  DID NOT ATTEND  Affect:  DID NOT ATTEND  Cognitive:  DID NOT ATTEND  Insight DID NOT ATTEND  Engagement in Group:  DID NOT ATTEND  Modes of Intervention:  DID NOT ATTEND  Additional Comments:  DID NOT ATTEND  Emerson Schreifels Martin Blu Lori 10/08/2013, 10:22 AM

## 2013-10-08 NOTE — Progress Notes (Signed)
[facility] MD Progress Note  10/08/2013 5:54 PM BRETTE CAST  MRN:  [id number] Subjective:  Farris endorses she is not doing as well today. States she did not sleep well. She admits to a lot of racing thoughts and agitation. Cant identify a trigger. States she get so think about death and suicide when she starts feeling like this. Would not hurt herself. States she has been pushing herself to go to the groups, to [employer],. States she still wants to go back to [employer]. Diagnosis:   DSM5: Schizophrenia Disorders:  none Obsessive-Compulsive Disorders:  none Trauma-Stressor Disorders:  none Substance/Addictive Disorders:  none Depressive Disorders:  Major Depressive Disorder - Severe (296.23) Total Time spent with patient: 30 minutes  Axis I: Generalized Anxiety Disorder, Mood Disorder NOS and Social Anxiety  ADL's:  Intact  Sleep: Poor  Appetite:  Fair  Suicidal Ideation:  Plan:  denies Intent:  denies Means:  denies Homicidal Ideation:  Plan:  denies Intent:  denies Means:  denies AEB (as evidenced by):  Psychiatric Specialty Exam: Physical Exam  Review of Systems  Constitutional: Positive for malaise/fatigue.  HENT: Negative.   Eyes: Negative.   Respiratory: Negative.   Cardiovascular: Negative.   Gastrointestinal: Negative.   Genitourinary: Negative.   Musculoskeletal: Negative.   Skin: Negative.   Neurological: Negative.   Endo/Heme/Allergies: Negative.   Psychiatric/Behavioral: Positive for depression and suicidal ideas. The patient is nervous/anxious and has insomnia.     Blood pressure 112/79, pulse 99, temperature 97.8 F (36.6 C), temperature source Oral, resp. rate 18, height 5\' 2"  (1.575 m), weight 113.399 kg (250 lb).Body mass index is 45.71 kg/(m^2).  General Appearance: Fairly Groomed  Engineer, water::  Fair  Speech:  Clear and Coherent  Volume:  Decreased  Mood:  Anxious, Depressed and worried  Affect:  anxious, worried  Thought Process:  Coherent and  Goal Directed  Orientation:  Full (Time, Place, and Person)  Thought Content:  symptoms worries concerns  Suicidal Thoughts:  intermittent  Homicidal Thoughts:  No  Memory:  Immediate;   Fair Recent;   Fair Remote;   Fair  Judgement:  Fair  Insight:  Present  Psychomotor Activity:  Restlessness  Concentration:  Fair  Recall:  [employer] of Knowledge:NA  Language: Fair  Akathisia:  No  Handed:    AIMS (if indicated):     Assets:  Desire for Improvement  Sleep:  Number of Hours: 5.5   Musculoskeletal: Strength & Muscle Tone: within normal limits Gait & Station: normal Patient leans: N/A  Current Medications: Current Facility-Administered Medications  Medication Dose Route Frequency Provider Last Rate Last Dose  . acetaminophen (TYLENOL) tablet 650 mg  650 mg Oral Q6H PRN Lurena Nida, NP   650 mg at 10/05/13 2056  . alum & mag hydroxide-simeth (MAALOX/MYLANTA) 200-200-20 MG/5ML suspension 30 mL  30 mL Oral Q4H PRN Lurena Nida, NP      . aspirin chewable tablet 81 mg  81 mg Oral Daily Lurena Nida, NP   81 mg at 10/08/13 0857  . carvedilol (COREG) tablet 6.25 mg  6.25 mg Oral BID Lurena Nida, NP   6.25 mg at 10/08/13 1702  . escitalopram (LEXAPRO) tablet 20 mg  20 mg Oral Daily Lurena Nida, NP   20 mg at 10/08/13 0857  . famotidine (PEPCID) tablet 20 mg  20 mg Oral BID Lurena Nida, NP   20 mg at 10/08/13 1702  . linagliptin (TRADJENTA) tablet 5 mg  5 mg Oral Daily Lurena Nida, NP   5 mg at 10/08/13 0857  . lisinopril (PRINIVIL,ZESTRIL) tablet 20 mg  20 mg Oral Daily Lurena Nida, NP   20 mg at 10/08/13 0857  . magnesium hydroxide (MILK OF MAGNESIA) suspension 30 mL  30 mL Oral Daily PRN Lurena Nida, NP      . metFORMIN (GLUCOPHAGE) tablet 500 mg  500 mg Oral Q breakfast Lurena Nida, NP   500 mg at 10/08/13 0857  . multivitamin with minerals tablet 1 tablet  1 tablet Oral Daily Lurena Nida, NP   1 tablet at 10/08/13 0857  . QUEtiapine (SEROQUEL) tablet 100 mg   100 mg Oral QHS Nicholaus Bloom, MD        Lab Results:  Results for orders placed during the hospital encounter of 10/05/13 (from the past 48 hour(s))  GLUCOSE, CAPILLARY     Status: None   Collection Time    10/07/13  6:02 AM      Result Value Ref Range   Glucose-Capillary 82  70 - 99 mg/dL  GLUCOSE, CAPILLARY     Status: Abnormal   Collection Time    10/07/13  4:51 PM      Result Value Ref Range   Glucose-Capillary 143 (*) 70 - 99 mg/dL  GLUCOSE, CAPILLARY     Status: Abnormal   Collection Time    10/08/13  6:06 AM      Result Value Ref Range   Glucose-Capillary 133 (*) 70 - 99 mg/dL  GLUCOSE, CAPILLARY     Status: Abnormal   Collection Time    10/08/13  5:06 PM      Result Value Ref Range   Glucose-Capillary 100 (*) 70 - 99 mg/dL   Comment 1 Notify RN      Physical Findings: AIMS: Facial and Oral Movements Muscles of Facial Expression: None, normal Lips and Perioral Area: None, normal Jaw: None, normal Tongue: None, normal,Extremity Movements Upper (arms, wrists, hands, fingers): None, normal Lower (legs, knees, ankles, toes): None, normal, Trunk Movements Neck, shoulders, hips: None, normal, Overall Severity Severity of abnormal movements (highest score from questions above): None, normal Incapacitation due to abnormal movements: None, normal Patient's awareness of abnormal movements (rate only patient's report): No Awareness, Dental Status Current problems with teeth and/or dentures?: No Does patient usually wear dentures?: No  CIWA:    COWS:     Treatment Plan Summary: Daily contact with patient to assess and evaluate symptoms and progress in treatment Medication management  Plan: Supportive approach/coping skills           CBT;mindfulness           She has used Seroquel successfully in the past. Willing to give it a try           Seroquel 100 mg HS   Medical Decision Making Problem Points:  Established problem, worsening (2) and Review of psycho-social  stressors (1) Data Points:  Review of medication regiment & side effects (2) Review of new medications or change in dosage (2)  I certify that inpatient services furnished can reasonably be expected to improve the patient's condition.   Nicholaus Bloom 10/08/2013, 5:54 PM

## 2013-10-08 NOTE — Progress Notes (Signed)
Recreation Therapy Notes  Animal-Assisted Activity/Therapy (AAA/T) Program Checklist/Progress Notes Patient Eligibility Criteria Checklist & Daily Group note for Rec Tx Intervention  Date: 06.02.2015 Time: 2:45pm Location: [address]    AAA/T Program Assumption of Risk Form signed by Patient/ or Parent Legal Guardian yes  Patient is free of allergies or sever asthma yes  Patient reports no fear of animals yes  Patient reports no history of cruelty to animals yes   Patient understands his/her participation is voluntary yes  Patient washes hands before animal contact yes  Patient washes hands after animal contact yes  Behavioral Response: Appropriate   Education: Hand Washing, Appropriate Animal Interaction   Education Outcome: Acknowledges understanding  Clinical Observations/Feedback: Patient interacted appropriately with therapeutic dog team, peers and LRT, as well as shared stories about her pets with group members.   Laureen Ochs Lauren Mccormick, LRT/CTRS  Laureen Ochs Lauren Mccormick 10/08/2013 4:31 PM

## 2013-10-08 NOTE — [facility] Group Notes (Signed)
[facility] LCSW Group Therapy  10/08/2013   1:15 PM   Type of Therapy:  Group Therapy  Participation Level:  Active  Participation Quality:  Attentive, Sharing and Supportive  Affect:  Depressed and Flat  Cognitive:  Alert and Oriented  Insight:  Developing/Improving and Engaged  Engagement in Therapy:  Developing/Improving and Engaged  Modes of Intervention:  Activity, Clarification, Confrontation, Discussion, Education, Exploration, Limit-setting, Orientation, Problem-solving, Rapport Building, Art therapist, Socialization and Support  Summary of Progress/Problems: Patient was attentive and engaged with speaker from [place].  Patient was attentive to speaker while they shared their story of dealing with mental health and overcoming it.  Patient expressed interest in their programs and services and received information on their agency.  Patient processed ways they can relate to the speaker.     Regan Lemming, LCSW 10/08/2013  2:11 PM

## 2013-10-08 NOTE — Progress Notes (Signed)
D:Pt came to writer in the hall on the unit crying and hugging stating "I can't do this anymore." A:Writer escorted pt to sit down with tissue and talk about what was upsetting her. Pt reports that she is depressed and hopeless. She says that she sometimes feels this way while out of the hospital and copes by "curling up in a ball" and being by herself. She lists no specific triggers at this time. Pt says that she had a hard time sleeping last night with racing thoughts and experiences this when she is by herself. Pt has a flat/sad affect. Offered support and encouragement. Gave medications as ordered.  R:Pt denies any specific thoughts of trying to hurt herself. She denies hi. Safety maintained on the unit.

## 2013-10-08 NOTE — Progress Notes (Signed)
Patient ID: Lauren Mccormick, female   DOB: 1965/11/29, 48 y.o.   MRN: [id number] D: Patient complains of continuing anxiety and depressive symptoms.  She has passive SI and contracts for safety on the unit.  Patient denies any HI/AVH.  Patient has been attempting to socialize more on the milieu.  She went to the cafeteria to eat dinner today.  She is attending groups and attempting to participate.  A: continue to monitor medication management and MD orders.  Safety checks completed every 15 minutes per protocol.  R: Patient is receptive to staff; her behavior is appropriate to situation.

## 2013-10-09 DIAGNOSIS — F411 Generalized anxiety disorder: Secondary | ICD-10-CM

## 2013-10-09 DIAGNOSIS — F39 Unspecified mood [affective] disorder: Secondary | ICD-10-CM

## 2013-10-09 LAB — GLUCOSE, CAPILLARY
Glucose-Capillary: 163 mg/dL — ABNORMAL HIGH (ref 70–99)
Glucose-Capillary: 111 mg/dL — ABNORMAL HIGH (ref 70–99)

## 2013-10-09 NOTE — Progress Notes (Signed)
Adult Psychoeducational Group Note  Date:  10/09/2013 Time:  12:10 AM  Group Topic/Focus:  Wrap-Up Group:   The focus of this group is to help patients review their daily goal of treatment and discuss progress on daily workbooks.  Participation Level:  Active  Participation Quality:  Appropriate  Affect:  Appropriate  Cognitive:  Appropriate  Insight: Appropriate  Engagement in Group:  Engaged  Modes of Intervention:  Discussion  Additional Comments:  Pt was present for wrap up group. She shared that she did not have a good day, but she did enjoy dinner today. She said they had hot dogs and fries. She shared that her goal today was to be more positive, and that it is going well.   Lauren Mccormick 10/09/2013, 12:10 AM

## 2013-10-09 NOTE — Progress Notes (Signed)
Patient ID: Lauren Mccormick, female   DOB: [birth year], 48 y.o.   MRN: [id number] [facility] MD Progress Note  10/09/2013 12:05 PM Lauren Mccormick  MRN:  [id number]  Subjective:  Lauren Mccormick endorses she is doing better today.Says, Seroquel helped her mood, sleep and anxiety. She adds that her racing thoughts are still there, but not as intense. Says she is making herself go to group sessions and has to force herself to stay through groups. She denies any SIHI. Plans to go back to [facility] after discharge.  Diagnosis:   DSM5: Schizophrenia Disorders:  none Obsessive-Compulsive Disorders:  none Trauma-Stressor Disorders:  none Substance/Addictive Disorders:  none Depressive Disorders:  Major Depressive Disorder - Severe (296.23) Total Time spent with patient: 30 minutes  Axis I: Generalized Anxiety Disorder, Mood Disorder NOS and Social Anxiety  ADL's:  Intact  Sleep: Poor  Appetite:  Fair  Suicidal Ideation:  Plan:  denies Intent:  denies Means:  denies  Homicidal Ideation:  Plan:  denies Intent:  denies Means:  denies AEB (as evidenced by):  Psychiatric Specialty Exam: Physical Exam  Review of Systems  Constitutional: Positive for malaise/fatigue.  HENT: Negative.   Eyes: Negative.   Respiratory: Negative.   Cardiovascular: Negative.   Gastrointestinal: Negative.   Genitourinary: Negative.   Musculoskeletal: Negative.   Skin: Negative.   Neurological: Negative.   Endo/Heme/Allergies: Negative.   Psychiatric/Behavioral: Positive for depression and suicidal ideas. The patient is nervous/anxious and has insomnia.     Blood pressure 111/79, pulse 93, temperature 98.2 F (36.8 C), temperature source Oral, resp. rate 18, height 5\' 2"  (1.575 m), weight 113.399 kg (250 lb).Body mass index is 45.71 kg/(m^2).  General Appearance: Fairly Groomed  Engineer, water::  Fair  Speech:  Clear and Coherent  Volume:  Decreased  Mood:  Anxious, Depressed and worried  Affect:  anxious, worried   Thought Process:  Coherent and Goal Directed  Orientation:  Full (Time, Place, and Person)  Thought Content:  symptoms worries concerns  Suicidal Thoughts:  intermittent  Homicidal Thoughts:  No  Memory:  Immediate;   Fair Recent;   Fair Remote;   Fair  Judgement:  Fair  Insight:  Present  Psychomotor Activity:  Restlessness  Concentration:  Fair  Recall:  [employer] of Knowledge:NA  Language: Fair  Akathisia:  No  Handed:    AIMS (if indicated):     Assets:  Desire for Improvement  Sleep:  Number of Hours: 6.75   Musculoskeletal: Strength & Muscle Tone: within normal limits Gait & Station: normal Patient leans: N/A  Current Medications: Current Facility-Administered Medications  Medication Dose Route Frequency Provider Last Rate Last Dose  . acetaminophen (TYLENOL) tablet 650 mg  650 mg Oral Q6H PRN Lurena Nida, NP   650 mg at 10/05/13 2056  . alum & mag hydroxide-simeth (MAALOX/MYLANTA) 200-200-20 MG/5ML suspension 30 mL  30 mL Oral Q4H PRN Lurena Nida, NP      . aspirin chewable tablet 81 mg  81 mg Oral Daily Lurena Nida, NP   81 mg at 10/09/13 0752  . carvedilol (COREG) tablet 6.25 mg  6.25 mg Oral BID Lurena Nida, NP   6.25 mg at 10/09/13 0755  . escitalopram (LEXAPRO) tablet 20 mg  20 mg Oral Daily Lurena Nida, NP   20 mg at 10/09/13 0752  . famotidine (PEPCID) tablet 20 mg  20 mg Oral BID Lurena Nida, NP   20 mg at 10/09/13 6237  .  linagliptin (TRADJENTA) tablet 5 mg  5 mg Oral Daily Lurena Nida, NP   5 mg at 10/09/13 0753  . lisinopril (PRINIVIL,ZESTRIL) tablet 20 mg  20 mg Oral Daily Lurena Nida, NP   20 mg at 10/09/13 0755  . magnesium hydroxide (MILK OF MAGNESIA) suspension 30 mL  30 mL Oral Daily PRN Lurena Nida, NP      . metFORMIN (GLUCOPHAGE) tablet 500 mg  500 mg Oral Q breakfast Lurena Nida, NP   500 mg at 10/09/13 0752  . multivitamin with minerals tablet 1 tablet  1 tablet Oral Daily Lurena Nida, NP   1 tablet at 10/09/13 0347  .  QUEtiapine (SEROQUEL) tablet 100 mg  100 mg Oral QHS Nicholaus Bloom, MD   100 mg at 10/08/13 2120    Lab Results:  Results for orders placed during the hospital encounter of 10/05/13 (from the past 48 hour(s))  GLUCOSE, CAPILLARY     Status: Abnormal   Collection Time    10/07/13  4:51 PM      Result Value Ref Range   Glucose-Capillary 143 (*) 70 - 99 mg/dL  GLUCOSE, CAPILLARY     Status: Abnormal   Collection Time    10/08/13  6:06 AM      Result Value Ref Range   Glucose-Capillary 133 (*) 70 - 99 mg/dL  GLUCOSE, CAPILLARY     Status: Abnormal   Collection Time    10/08/13  5:06 PM      Result Value Ref Range   Glucose-Capillary 100 (*) 70 - 99 mg/dL   Comment 1 Notify RN    GLUCOSE, CAPILLARY     Status: Abnormal   Collection Time    10/08/13  9:00 PM      Result Value Ref Range   Glucose-Capillary 149 (*) 70 - 99 mg/dL  GLUCOSE, CAPILLARY     Status: Abnormal   Collection Time    10/09/13  7:22 AM      Result Value Ref Range   Glucose-Capillary 163 (*) 70 - 99 mg/dL    Physical Findings: AIMS: Facial and Oral Movements Muscles of Facial Expression: None, normal Lips and Perioral Area: None, normal Jaw: None, normal Tongue: None, normal,Extremity Movements Upper (arms, wrists, hands, fingers): None, normal Lower (legs, knees, ankles, toes): None, normal, Trunk Movements Neck, shoulders, hips: None, normal, Overall Severity Severity of abnormal movements (highest score from questions above): None, normal Incapacitation due to abnormal movements: None, normal Patient's awareness of abnormal movements (rate only patient's report): No Awareness, Dental Status Current problems with teeth and/or dentures?: No Does patient usually wear dentures?: No  CIWA:    COWS:     Treatment Plan Summary: Daily contact with patient to assess and evaluate symptoms and progress in treatment Medication management  Plan: Supportive approach/coping skills CBT;mindfulness Continue  Seroquel 100 mg HS   Medical Decision Making Problem Points:  Established problem, worsening (2) and Review of psycho-social stressors (1) Data Points:  Review of medication regiment & side effects (2) Review of new medications or change in dosage (2)  I certify that inpatient services furnished can reasonably be expected to improve the patient's condition.   Lauren Mccormick, PMHNP-BC 10/09/2013, 12:05 PM I personally evaluated the patient and agree with assessment and plan Geralyn Flash A. Sabra Heck, M.D.

## 2013-10-09 NOTE — Progress Notes (Signed)
Patient ID: Lauren Mccormick, female   DOB: 10/26/1965, 48 y.o.   MRN: [id number]  D: Pt informed the writer that she "slept good". Stated, "I feel better today". When asked about her discussions with her Dr, pt stated, "I asked to go home Friday". Pt states her mom found her a place and it should be available Friday. "I pray that I get it".   A:  Support and encouragement was offered. 15 min checks continued for safety.  R: Pt remains safe.

## 2013-10-09 NOTE — BHH Group Notes (Signed)
[facility] LCSW Group Therapy  10/09/2013  1:15 PM   Type of Therapy:  Group Therapy  Participation Level:  Active  Participation Quality:  Attentive, Sharing and Supportive  Affect:  Depressed and Flat  Cognitive:  Alert and Oriented  Insight:  Developing/Improving and Engaged  Engagement in Therapy:  Developing/Improving and Engaged  Modes of Intervention:  Clarification, Confrontation, Discussion, Education, Exploration, Limit-setting, Orientation, Problem-solving, Rapport Building, Art therapist, Socialization and Support  Summary of Progress/Problems: The topic for group today was emotional regulation.  This group focused on both positive and negative emotion identification and allowed group members to process ways to identify feelings, regulate negative emotions, and find healthy ways to manage internal/external emotions. Group members were asked to reflect on a time when their reaction to an emotion led to a negative outcome and explored how alternative responses using emotion regulation would have benefited them. Group members were also asked to discuss a time when emotion regulation was utilized when a negative emotion was experienced.  Pt discussed being angry with her mother and lashing out by screaming and getting out of control.  Pt states that she could've left the situation and come back to talk about it when she was calmer.  Pt was supportive to peers, actively participated and engaged in group discussion.    Regan Lemming, LCSW 10/09/2013  2:55 PM

## 2013-10-09 NOTE — Tx Team (Signed)
Interdisciplinary Treatment Plan Update (Adult)  Date: 10/09/2013  Time Reviewed:  9:45 AM  Progress in Treatment: Attending groups: Yes Participating in groups:  Yes Taking medication as prescribed:  Yes Tolerating medication:  Yes Family/Significant othe contact made: No, pt refused Patient understands diagnosis:  Yes Discussing patient identified problems/goals with staff:  Yes Medical problems stabilized or resolved:  Yes Denies suicidal/homicidal ideation: Yes Issues/concerns per patient self-inventory:  Yes Other:  New problem(s) identified: N/A  Discharge Plan or Barriers: CSW assessing for appropriate referrals.  Pt is homeless and is to call shelters to arrange d/c plans.    Reason for Continuation of Hospitalization: Anxiety Depression Medication Stabilization Detox  Comments: N/A  Estimated length of stay: 2-3 days  For review of initial/current patient goals, please see plan of care.  Attendees: Patient:     Family:     Physician:  Dr. Sabra Heck 10/09/2013 10:52 AM   Nursing:   Para March, RN 10/09/2013 10:52 AM   Clinical Social Worker:  Regan Lemming, LCSW 10/09/2013 10:52 AM   Other: Lars Pinks, RN case manager 10/09/2013 10:52 AM   Other:  Maxie Better, LCSWA 10/09/2013 10:52 AM   Other:  Agustina Caroli, NP 10/09/2013 10:52 AM   Other:  Satira Sark, RN 10/09/2013 10:52 AM   Other:    Other:    Other:    Other:    Other:    Other:     Scribe for Treatment Team:   Ane Payment, 10/09/2013 , 10:52 AM

## 2013-10-09 NOTE — Progress Notes (Signed)
Adult Psychoeducational Group Note  Date:  10/09/2013 Time:  11:31 PM  Group Topic/Focus:  Wrap-Up Group:   The focus of this group is to help patients review their daily goal of treatment and discuss progress on daily workbooks.  Participation Level:  Active  Participation Quality:  Appropriate  Affect:  Appropriate  Cognitive:  Alert  Insight: Appropriate  Engagement in Group:  Engaged  Modes of Intervention:  Discussion  Additional Comments:  Pt stated that today was a good day and things are starting to look up for her. She also has a improvement in her mood.  Sharmon Revere 10/09/2013, 11:31 PM

## 2013-10-09 NOTE — Progress Notes (Signed)
D: Patient has appropriate affect and pleasant mood, but tends to isolate to her room. She reported on the self inventory sheet that she's sleeping well, appetite is good, high energy level and improving ability to pay attention. Patient rates depression "8" and feelings of hopelessness "5". She's actively participating in groups. Patient adheres to current medication regimen.  A: Support and encouragement provided to patient. Administered scheduled medications per ordering MD. Monitor Q15 minute checks for safety.  R: Patient receptive. Denies SI/HI and AVH. Patient remains safe on the unit.

## 2013-10-09 NOTE — BHH Group Notes (Signed)
[facility] LCSW Aftercare Discharge Planning Group Note   10/09/2013 8:45 AM  Participation Quality:  Alert, Appropriate and Oriented  Mood/Affect:  Anxious  Depression Rating:  6  Anxiety Rating:  6  Thoughts of Suicide:  Pt denies SI/HI  Will you contract for safety?   Yes  Current AVH:  Pt denies  Plan for Discharge/Comments:  Pt attended discharge planning group and actively participated in group.  CSW provided pt with today's workbook.  Pt reports feeling much better today.  Pt is homeless and is to work on calling homeless shelters today to arrange d/c plans.  CSW will continue assist pt with d/c plans.  No further needs voiced by pt at this time.    Transportation Means: Pt reports access to transportation  Supports: No supports mentioned at this time  Regan Lemming, LCSW 10/09/2013 9:56 AM

## 2013-10-10 LAB — GLUCOSE, CAPILLARY
GLUCOSE-CAPILLARY: 121 mg/dL — AB (ref 70–99)
Glucose-Capillary: 120 mg/dL — ABNORMAL HIGH (ref 70–99)

## 2013-10-10 NOTE — Progress Notes (Signed)
[facility] MD Progress Note  10/10/2013 4:44 PM Lauren Mccormick  MRN:  [id number] Subjective:  States that her mother informed her that she found and apartment for her. She got excited, but admits to a "little voice in her head" that tells her that it is not going to happen, that things will not work out. She states she tries to fight this voice but at times she is not successful and give in to feeling hopeless helpless Diagnosis:   DSM5: Schizophrenia Disorders:  Denies Obsessive-Compulsive Disorders:  Denies Trauma-Stressor Disorders:  Denies Substance/Addictive Disorders:  Denies Depressive Disorders:  Major Depressive Disorder - Moderate (296.22) Total Time spent with patient: 30 minutes  Axis I: Generalized Anxiety Disorder and Social Anxiety  ADL's:  Intact  Sleep: Fair  Appetite:  Fair  Suicidal Ideation:  Plan:  denies Intent:  denies Means:  denies Homicidal Ideation:  Plan:  denies Intent:  denies Means:  denies AEB (as evidenced by):  Psychiatric Specialty Exam: Physical Exam  Review of Systems  Constitutional: Negative.   HENT: Negative.   Eyes: Negative.   Respiratory: Negative.   Cardiovascular: Negative.   Gastrointestinal: Negative.   Genitourinary: Negative.   Musculoskeletal: Negative.   Skin: Negative.   Neurological: Negative.   Endo/Heme/Allergies: Negative.   Psychiatric/Behavioral: Positive for depression. The patient is nervous/anxious.     Blood pressure 183/0, pulse 92, temperature 97.5 F (36.4 C), temperature source Oral, resp. rate 16, height 5\' 2"  (1.575 m), weight 113.399 kg (250 lb).Body mass index is 45.71 kg/(m^2).  General Appearance: Fairly Groomed  Engineer, water::  Fair  Speech:  Clear and Coherent  Volume:  Normal  Mood:  Anxious and worried  Affect:  anxious, worried  Thought Process:  Coherent and Goal Directed  Orientation:  Full (Time, Place, and Person)  Thought Content:  symptoms, worries, concerns  Suicidal Thoughts:  No   Homicidal Thoughts:  No  Memory:  Immediate;   Fair Recent;   Fair Remote;   Fair  Judgement:  Intact  Insight:  Present  Psychomotor Activity:  Restlessness  Concentration:  Fair  Recall:  [employer] of Knowledge:Fair  Language: Fair  Akathisia:  No  Handed:    AIMS (if indicated):     Assets:  Desire for Improvement  Sleep:  Number of Hours: 6.25   Musculoskeletal: Strength & Muscle Tone: within normal limits Gait & Station: normal Patient leans: N/A  Current Medications: Current Facility-Administered Medications  Medication Dose Route Frequency Provider Last Rate Last Dose  . acetaminophen (TYLENOL) tablet 650 mg  650 mg Oral Q6H PRN Lurena Nida, NP   650 mg at 10/05/13 2056  . alum & mag hydroxide-simeth (MAALOX/MYLANTA) 200-200-20 MG/5ML suspension 30 mL  30 mL Oral Q4H PRN Lurena Nida, NP      . aspirin chewable tablet 81 mg  81 mg Oral Daily Lurena Nida, NP   81 mg at 10/10/13 0840  . carvedilol (COREG) tablet 6.25 mg  6.25 mg Oral BID Lurena Nida, NP   6.25 mg at 10/10/13 0841  . escitalopram (LEXAPRO) tablet 20 mg  20 mg Oral Daily Lurena Nida, NP   20 mg at 10/10/13 0840  . famotidine (PEPCID) tablet 20 mg  20 mg Oral BID Lurena Nida, NP   20 mg at 10/10/13 0841  . linagliptin (TRADJENTA) tablet 5 mg  5 mg Oral Daily Lurena Nida, NP   5 mg at 10/10/13 0840  . lisinopril (  PRINIVIL,ZESTRIL) tablet 20 mg  20 mg Oral Daily Lurena Nida, NP   20 mg at 10/10/13 0840  . magnesium hydroxide (MILK OF MAGNESIA) suspension 30 mL  30 mL Oral Daily PRN Lurena Nida, NP      . metFORMIN (GLUCOPHAGE) tablet 500 mg  500 mg Oral Q breakfast Lurena Nida, NP   500 mg at 10/10/13 0839  . multivitamin with minerals tablet 1 tablet  1 tablet Oral Daily Lurena Nida, NP   1 tablet at 10/10/13 0840  . QUEtiapine (SEROQUEL) tablet 100 mg  100 mg Oral QHS Nicholaus Bloom, MD   100 mg at 10/09/13 2125    Lab Results:  Results for orders placed during the hospital encounter  of 10/05/13 (from the past 48 hour(s))  GLUCOSE, CAPILLARY     Status: Abnormal   Collection Time    10/08/13  5:06 PM      Result Value Ref Range   Glucose-Capillary 100 (*) 70 - 99 mg/dL   Comment 1 Notify RN    GLUCOSE, CAPILLARY     Status: Abnormal   Collection Time    10/08/13  9:00 PM      Result Value Ref Range   Glucose-Capillary 149 (*) 70 - 99 mg/dL  GLUCOSE, CAPILLARY     Status: Abnormal   Collection Time    10/09/13  7:22 AM      Result Value Ref Range   Glucose-Capillary 163 (*) 70 - 99 mg/dL  GLUCOSE, CAPILLARY     Status: Abnormal   Collection Time    10/09/13  4:45 PM      Result Value Ref Range   Glucose-Capillary 111 (*) 70 - 99 mg/dL  GLUCOSE, CAPILLARY     Status: Abnormal   Collection Time    10/10/13  7:27 AM      Result Value Ref Range   Glucose-Capillary 120 (*) 70 - 99 mg/dL    Physical Findings: AIMS: Facial and Oral Movements Muscles of Facial Expression: None, normal Lips and Perioral Area: None, normal Jaw: None, normal Tongue: None, normal,Extremity Movements Upper (arms, wrists, hands, fingers): None, normal Lower (legs, knees, ankles, toes): None, normal, Trunk Movements Neck, shoulders, hips: None, normal, Overall Severity Severity of abnormal movements (highest score from questions above): None, normal Incapacitation due to abnormal movements: None, normal Patient's awareness of abnormal movements (rate only patient's report): No Awareness, Dental Status Current problems with teeth and/or dentures?: No Does patient usually wear dentures?: No  CIWA:    COWS:     Treatment Plan Summary: Daily contact with patient to assess and evaluate symptoms and progress in treatment Medication management  Plan: Supportive approach/coping skills           CBT;mindfulness address the cognitive distortion, the catastrophic thinking            Medical Decision Making Problem Points:  Review of psycho-social stressors (1) Data Points:  Review of  medication regiment & side effects (2)  I certify that inpatient services furnished can reasonably be expected to improve the patient's condition.   Nicholaus Bloom 10/10/2013, 4:44 PM

## 2013-10-10 NOTE — Progress Notes (Signed)
Pt attended karaoke group this evening.  

## 2013-10-10 NOTE — Progress Notes (Signed)
Pt has been up and active in the milieu today.  She rated her depression 3 hopelessness a 2 and anxiety a 4 on her self-inventory.  She denies any S/H ideation or A/V/H.  She is looking for a psychiatrist and therapist.  She plans to talk with her case manager

## 2013-10-10 NOTE — BHH Group Notes (Signed)
0900 nursing orientation group   The focus of this group is to educate the patient on the purpose and policies of crisis stabilization and provide a format to answer questions about their admission.  The group details unit policies and expectations of patients while admitted.  Pt was an active participant in group her goal today "having a positive outlook on things on my life and have my apartment by tomorrow" She talked about how she loves to cook new foods"

## 2013-10-10 NOTE — BHH Group Notes (Signed)
[facility] LCSW Group Therapy  10/10/2013  1:15 PM   Type of Therapy:  Group Therapy  Participation Level:  Active  Participation Quality:  Attentive, Sharing and Supportive  Affect:  Calm  Cognitive:  Alert and Oriented  Insight:  Developing/Improving and Engaged  Engagement in Therapy:  Developing/Improving and Engaged  Modes of Intervention:  Clarification, Confrontation, Discussion, Education, Exploration, Limit-setting, Orientation, Problem-solving, Rapport Building, Art therapist, Socialization and Support  Summary of Progress/Problems: The topic for group was balance in life.  Today's group focused on defining balance in one's own words, identifying things that can knock one off balance, and exploring healthy ways to maintain balance in life. Group members were asked to provide an example of a time when they felt off balance, describe how they handled that situation,and process healthier ways to regain balance in the future. Group members were asked to share the most important tool for maintaining balance that they learned while at [facility] and how they plan to apply this method after discharge.  Pt shared that her home life was out of balance, due to the conflict with her mother, which led to her emotional distress.  Pt discussed her plans to move when she returns home and take her meds as prescribed.  Pt appears to be more positive today.  Pt actively participated and was engaged in group discussion.    Regan Lemming, LCSW 10/10/2013  2:13 PM

## 2013-10-10 NOTE — Progress Notes (Signed)
Pt appears in good spirits since she is leaving tomorrow. She stated her mom will help her get an apartment but that all the application has to be on line which she may need help with. Pt was instructed to go to [employer] and let someone there help her with the process. Pt FSBS was 121. She is pleasant and cooperative and denies Si and HI. Pt has been in the dayroom with the other pts.

## 2013-10-10 NOTE — Progress Notes (Signed)
Recreation Therapy Notes  Animal-Assisted Activity/Therapy (AAA/T) Program Checklist/Progress Notes Patient Eligibility Criteria Checklist & Daily Group note for Rec Tx Intervention  Date: 06.04.2015 Time: 2:45pm Location: [address]    AAA/T Program Assumption of Risk Form signed by Patient/ or Parent Legal Guardian yes  Patient is free of allergies or sever asthma yes  Patient reports no fear of animals yes  Patient reports no history of cruelty to animals yes   Patient understands his/her participation is voluntary yes  Patient washes hands before animal contact yes  Patient washes hands after animal contact yes  Behavioral Response: Appropriate   Education: Hand Washing, Appropriate Animal Interaction   Education Outcome: Acknowledges understanding   Clinical Observations/Feedback: Patient interacted appropriately with pet therapy team and peers.  Lauren Mccormick, LRT/CTRS        Lauren Mccormick 10/10/2013 5:06 PM

## 2013-10-11 DIAGNOSIS — F332 Major depressive disorder, recurrent severe without psychotic features: Principal | ICD-10-CM

## 2013-10-11 LAB — GLUCOSE, CAPILLARY
Glucose-Capillary: 85 mg/dL (ref 70–99)
Glucose-Capillary: 75 mg/dL (ref 70–99)

## 2013-10-11 MED ORDER — CARVEDILOL 6.25 MG PO TABS: 6.2500 mg | ORAL_TABLET | Freq: Two times a day (BID) | ORAL | Status: DC

## 2013-10-11 MED ORDER — ADULT MULTIVITAMIN W/MINERALS CH: 1.0000 | ORAL_TABLET | Freq: Every day | ORAL | Status: AC

## 2013-10-11 MED ORDER — ESCITALOPRAM OXALATE 20 MG PO TABS: 20.0000 mg | ORAL_TABLET | Freq: Every day | ORAL | Status: DC

## 2013-10-11 MED ORDER — ASPIRIN 81 MG PO CHEW: 81.0000 mg | CHEWABLE_TABLET | Freq: Every day | ORAL | Status: AC

## 2013-10-11 MED ORDER — LISINOPRIL 20 MG PO TABS: 20.0000 mg | ORAL_TABLET | Freq: Every day | ORAL | Status: DC

## 2013-10-11 MED ORDER — FAMOTIDINE 20 MG PO TABS: 20.0000 mg | ORAL_TABLET | Freq: Two times a day (BID) | ORAL | Status: DC

## 2013-10-11 MED ORDER — QUETIAPINE FUMARATE 100 MG PO TABS: 100.0000 mg | ORAL_TABLET | Freq: Every day | ORAL | Status: DC

## 2013-10-11 MED ORDER — SITAGLIPTIN PHOSPHATE 100 MG PO TABS: 100.0000 mg | ORAL_TABLET | Freq: Every day | ORAL | Status: DC

## 2013-10-11 MED ORDER — METFORMIN HCL 500 MG PO TABS: 500.0000 mg | ORAL_TABLET | Freq: Every day | ORAL | Status: DC

## 2013-10-11 NOTE — Tx Team (Signed)
Interdisciplinary Treatment Plan Update (Adult)  Date: 10/11/2013  Time Reviewed:  9:45 AM  Progress in Treatment: Attending groups: Yes Participating in groups:  Yes Taking medication as prescribed:  Yes Tolerating medication:  Yes Family/Significant othe contact made: No, pt refused Patient understands diagnosis:  Yes Discussing patient identified problems/goals with staff:  Yes Medical problems stabilized or resolved:  Yes Denies suicidal/homicidal ideation: Yes Issues/concerns per patient self-inventory:  Yes Other:  New problem(s) identified: N/A  Discharge Plan or Barriers: Pt will follow up at [facility] for outpatient medication management and therapy.    Reason for Continuation of Hospitalization: Stable to d/c today  Comments: N/A  Estimated length of stay: D/C today  For review of initial/current patient goals, please see plan of care.  Attendees: Patient:     Family:     Physician:  Dr. Sabra Heck 10/11/2013 10:24 AM   Nursing:    10/11/2013 10:24 AM   Clinical Social Worker:  Regan Lemming, LCSW 10/11/2013 10:24 AM   Other: Lars Pinks, RN case manager 10/11/2013 10:24 AM   Other:  Maxie Better, [place] 10/11/2013 10:24 AM   Other: 10/11/2013 10:24 AM   Other:     Other:    Other:    Other:    Other:    Other:    Other:     Scribe for Treatment Team:   Ane Payment, 10/11/2013 , 10:24 AM

## 2013-10-11 NOTE — BHH Group Notes (Signed)
[facility] LCSW Aftercare Discharge Planning Group Note   10/11/2013 8:45 AM  Participation Quality:  Alert, Appropriate and Oriented  Mood/Affect:  Calm  Depression Rating:  5  Anxiety Rating:  5  Thoughts of Suicide:  Pt denies SI/HI  Will you contract for safety?   Yes  Current AVH:  Pt denies  Plan for Discharge/Comments:  Pt attended discharge planning group and actively participated in group.  CSW provided pt with today's workbook.  Pt reports feeling well today and ready to d/c.  Pt plans to either return home with mother or move right into an apartment she is working on getting in [place].  Pt plans to eventually relocate to [place], [place].  Pt has follow up scheduled at [facility] for outpatient medication management and therapy.  No further needs voiced by pt at this time.    Transportation Means: Pt reports access to transportation - mother will pick pt up  Supports: No supports mentioned at this time  Regan Lemming, [place] 10/11/2013 9:58 AM

## 2013-10-11 NOTE — Progress Notes (Signed)
[facility] Adult Case Management Discharge Plan :  Will you be returning to the same living situation after discharge: Yes,  pt was kicked out of mother's home prior to admission and states she can either stay with her or move directly into her new apartment At discharge, do you have transportation home?:Yes,  mother will pick pt up Do you have the ability to pay for your medications:Yes,  provided pt with samples and prescriptions and pt referred to [facility] for assistance with affording meds  Release of information consent forms completed and in the chart;  Patient's signature needed at discharge.  Patient to Follow up at: Follow-up Information   Follow up with [facility] On 10/15/2013. (Walk in on this date for hospital discharge appointment. Walk in clinic is Monday - Friday 8 am - 3 pm. They will than schedule you for medication management and therapy. )    Contact information:   [address], [place] [address] Phone: [phone] Fax: [phone]      Patient denies SI/HI:   Yes,  denies SI/HI    Safety Planning and Suicide Prevention discussed:  Yes,  discussed with pt.  Pt refused consent to contact family/friend.  See suicide prevention education note.   Gethsemane Fischler N Horton 10/11/2013, 10:48 AM

## 2013-10-11 NOTE — BHH Group Notes (Signed)
[facility] LCSW Group Therapy  10/11/2013  1:15 PM   Type of Therapy:  Group Therapy  Participation Level:  Active  Participation Quality:  Attentive, Sharing and Supportive  Affect:  Bright  Cognitive:  Alert and Oriented  Insight:  Developing/Improving and Engaged  Engagement in Therapy:  Developing/Improving and Engaged  Modes of Intervention:  Clarification, Confrontation, Discussion, Education, Exploration, Limit-setting, Orientation, Problem-solving, Rapport Building, Art therapist, Socialization and Support  Summary of Progress/Problems: The topic for today was feelings about relapse.  Pt discussed what relapse prevention is to them and identified triggers that they are on the path to relapse.  Pt processed their feeling towards relapse and was able to relate to peers.  Pt discussed coping skills that can be used for relapse prevention.  Pt states that she plans to stay positive and not let the depression control her.  Pt was positive and encouraging to peers before leaving group to d/c.  Pt actively participated and engaged in group discussion.   Regan Lemming, LCSW 10/11/2013  2:19 PM

## 2013-10-11 NOTE — Progress Notes (Addendum)
Pt appears very excited today to be leaving . She is limited at times and often needs things repeated so she can try to understand what is being said to her. Pt does contract for safety and denies SI and HI. She stated she slept well due to the seroquel that was given to her at bedtime. FSBS at 12noon was 75. 2:15pm -Pt was walked out and given all her belongings. She denies feeling SI or HI and should she feel this way she will contact 911. Pt was walked out and got in the car with relatives. She was very pleasant and cooperative.

## 2013-10-11 NOTE — Discharge Summary (Signed)
Physician Discharge Summary Note  Patient:  Lauren Mccormick is an 48 y.o., female MRN:  [id number] DOB:  22-Sep-1965 Patient phone:  [phone] (home)  Patient address:   [place] [address],  Total Time spent with patient: Greater than 30 minutes  Date of Admission:  10/05/2013 Date of Discharge: 10/11/13  Reason for Admission: Mood stabilization  Discharge Diagnoses: Active Problems:   MDD (major depressive disorder), recurrent episode, severe   GAD (generalized anxiety disorder)   Social anxiety disorder   Psychiatric Specialty Exam: Physical Exam  Psychiatric: Her speech is normal and behavior is normal. Judgment and thought content normal. Her mood appears not anxious. Her affect is not angry, not blunt, not labile and not inappropriate. Cognition and memory are normal. She does not exhibit a depressed mood.    Review of Systems  Constitutional: Negative.   HENT: Negative.   Eyes: Negative.   Respiratory: Negative.   Cardiovascular: Negative.   Gastrointestinal: Negative.   Genitourinary: Negative.   Musculoskeletal: Negative.   Skin: Negative.   Neurological: Negative.   Endo/Heme/Allergies: Negative.   Psychiatric/Behavioral: Positive for depression (Stable). Negative for suicidal ideas, hallucinations, memory loss and substance abuse. The patient has insomnia (Stable). The patient is not nervous/anxious.     Blood pressure 120/84, pulse 91, temperature 96.5 F (35.8 C), temperature source Oral, resp. rate 20, height 5\' 2"  (1.575 m), weight 113.399 kg (250 lb).Body mass index is 45.71 kg/(m^2).   General Appearance: Fairly Groomed   Engineer, water:: Fair   Speech: Clear and Coherent   Volume: Normal   Mood: Euthymic   Affect: Appropriate   Thought Process: Coherent and Goal Directed   Orientation: Full (Time, Place, and Person)   Thought Content: plans as she moves on   Suicidal Thoughts: No   Homicidal Thoughts: No   Memory: Immediate; Fair   Recent; Fair  Remote; Fair   Judgement: Fair   Insight: Fair   Psychomotor Activity: Normal   Concentration: Fair   Recall: [employer] of Knowledge:NA   Language: Fair   Akathisia: No   Handed:   AIMS (if indicated):   Assets: Desire for Improvement   Sleep: Number of Hours: 6.5    Past Psychiatric History: Diagnosis: MDD (major depressive disorder), recurrent episode, severe, GAD, Social anxiety disorder  Hospitalizations: [facility] adult unit  Outpatient Care: [facility]  Substance Abuse Care: NA  Self-Mutilation: NA  Suicidal Attempts: Hx of  Violent Behaviors: NA   Musculoskeletal: Strength & Muscle Tone: within normal limits Gait & Station: normal Patient leans: N/A  DSM5: Schizophrenia Disorders:  NA Obsessive-Compulsive Disorders:  NA Trauma-Stressor Disorders:  NA Substance/Addictive Disorders: NA  Depressive Disorders:  MDD (major depressive disorder), recurrent episode, severe, GAD, Social anxiety disorder   Axis Diagnosis:  AXIS I:  MDD (major depressive disorder), recurrent episode, severe, GAD, Social anxiety disorder AXIS II:  Mental retardation, severity unknown AXIS III:   Past Medical History  Diagnosis Date  . Anemia   . Depression   . Diabetes mellitus without complication   . Hypertension    AXIS IV:  other psychosocial or environmental problems AXIS V:  63  Level of Care:  OP  Hospital Course:  Positive for suicidal ideations and a plan to overdose, past attempt 9-10 years ago. Denies homicidal ideations, hallucinations, and drug/alcohol use. She takes Lexapro for depression and sees a Teacher, music at [facility]. Patient may possibly have a borderline IQ based on poor thought processes.  Lauren Mccormick was admitted with worsening depression with suicidal ideations. She had a plan to overdose on pills, but did not act on it. She required mood stabilization. She was already on Lexapro 20 mg prior to her admission, and this was resumed after  her admission assessment. However, Seroquel 100 mg Q bedtime was added for mood control and to argument her antidepressant therapy. This combination therapy seem to work for her as she reported gradual mood improvement on daily basis. She was enrolled in the group counseling sessions being offered on this unit. She learned coping skills. Lauren Mccormick was resumed on all her pertinent home medication for her other medical issues. She tolerated her treatment regiment without any adverse effects and or reactions.   Lauren Mccormick mood is improved. This is evidenced by her presentation of good affect and eye contact, including reports of improved mood. She is currently being discharged to continue psychiatric care and medication management at the [facility] here in [place], [place]. She is provided with all the necessary information required to make this appointment without problems. Upon discharge, she adamantly denies any SIHI, AVH, delusional thoughts, paranoia and or withdrawal symptoms. She received a 4 days worth, supply samples of her [facility] discharge medications. She left [facility] with all belongings in no distress. Transportation per mother.   Consults:  psychiatry  Significant Diagnostic Studies:  labs: CBC with diff, CMP, UDS, toxicology tests, U/A  Discharge Vitals:   Blood pressure 120/84, pulse 91, temperature 96.5 F (35.8 C), temperature source Oral, resp. rate 20, height 5\' 2"  (1.575 m), weight 113.399 kg (250 lb). Body mass index is 45.71 kg/(m^2). Lab Results:   Results for orders placed during the hospital encounter of 10/05/13 (from the past 72 hour(s))  GLUCOSE, CAPILLARY     Status: Abnormal   Collection Time    10/08/13  5:06 PM      Result Value Ref Range   Glucose-Capillary 100 (*) 70 - 99 mg/dL   Comment 1 Notify RN    GLUCOSE, CAPILLARY     Status: Abnormal   Collection Time    10/08/13  9:00 PM      Result Value Ref Range   Glucose-Capillary 149 (*) 70 - 99 mg/dL  GLUCOSE, CAPILLARY      Status: Abnormal   Collection Time    10/09/13  7:22 AM      Result Value Ref Range   Glucose-Capillary 163 (*) 70 - 99 mg/dL  GLUCOSE, CAPILLARY     Status: Abnormal   Collection Time    10/09/13  4:45 PM      Result Value Ref Range   Glucose-Capillary 111 (*) 70 - 99 mg/dL  GLUCOSE, CAPILLARY     Status: Abnormal   Collection Time    10/10/13  7:27 AM      Result Value Ref Range   Glucose-Capillary 120 (*) 70 - 99 mg/dL  GLUCOSE, CAPILLARY     Status: Abnormal   Collection Time    10/10/13  4:53 PM      Result Value Ref Range   Glucose-Capillary 121 (*) 70 - 99 mg/dL   Comment 1 Documented in Chart     Comment 2 Notify RN    GLUCOSE, CAPILLARY     Status: None   Collection Time    10/11/13  6:11 AM      Result Value Ref Range   Glucose-Capillary 85  70 - 99 mg/dL    Physical Findings: AIMS: Facial and Oral Movements Muscles  of Facial Expression: None, normal Lips and Perioral Area: None, normal Jaw: None, normal Tongue: None, normal,Extremity Movements Upper (arms, wrists, hands, fingers): None, normal Lower (legs, knees, ankles, toes): None, normal, Trunk Movements Neck, shoulders, hips: None, normal, Overall Severity Severity of abnormal movements (highest score from questions above): None, normal Incapacitation due to abnormal movements: None, normal Patient's awareness of abnormal movements (rate only patient's report): No Awareness, Dental Status Current problems with teeth and/or dentures?: No Does patient usually wear dentures?: No  CIWA:    COWS:     Psychiatric Specialty Exam: See Psychiatric Specialty Exam and Suicide Risk Assessment completed by Attending Physician prior to discharge.  Discharge destination:  Home  Is patient on multiple antipsychotic therapies at discharge:  No   Has Patient had three or more failed trials of antipsychotic monotherapy by history:  No  Recommended Plan for Multiple Antipsychotic Therapies: NA     Medication List     STOP taking these medications       IRON CR PO     OVER THE COUNTER MEDICATION     oxymetazoline 0.05 % nasal spray  Commonly known as:  AFRIN     vitamin C 1000 MG tablet      TAKE these medications     Indication   aspirin 81 MG chewable tablet  Chew 1 tablet (81 mg total) by mouth daily. For heart health   Indication:  Heart health     carvedilol 6.25 MG tablet  Commonly known as:  COREG  Take 1 tablet (6.25 mg total) by mouth 2 (two) times daily. For high blood pressure   Indication:  High Blood Pressure of Unknown Cause     escitalopram 20 MG tablet  Commonly known as:  LEXAPRO  Take 1 tablet (20 mg total) by mouth daily. For depression   Indication:  Depression     famotidine 20 MG tablet  Commonly known as:  PEPCID  Take 1 tablet (20 mg total) by mouth 2 (two) times daily. For acid reflux   Indication:  Gastroesophageal Reflux Disease     lisinopril 20 MG tablet  Commonly known as:  PRINIVIL,ZESTRIL  Take 1 tablet (20 mg total) by mouth daily. For high blood pressure   Indication:  High Blood Pressure     metFORMIN 500 MG tablet  Commonly known as:  GLUCOPHAGE  Take 1 tablet (500 mg total) by mouth daily with breakfast. For diabetes   Indication:  Type 2 Diabetes     multivitamin with minerals Tabs tablet  Take 1 tablet by mouth daily. For low vitamin   Indication:  Low vitamin     QUEtiapine 100 MG tablet  Commonly known as:  SEROQUEL  Take 1 tablet (100 mg total) by mouth at bedtime. For mood control/sleep   Indication:  Trouble Sleeping, Mood control     sitaGLIPtin 100 MG tablet  Commonly known as:  JANUVIA  Take 1 tablet (100 mg total) by mouth daily. For diabetes   Indication:  Type 2 Diabetes       Follow-up Information   Follow up with [facility] On 10/15/2013. (Walk in on this date for hospital discharge appointment. Walk in clinic is Monday - Friday 8 am - 3 pm. They will than schedule you for medication management and therapy. )     Contact information:   [address], [place] [address] Phone: [phone] Fax: [phone]     Follow-up recommendations:  Activity:  As tolerated  Diet: As recommended by your primary care doctor. Keep all scheduled follow-up appointments as recommended.  Comments: Take all your medications as prescribed by your mental healthcare provider. Report any adverse effects and or reactions from your medicines to your outpatient provider promptly. Patient is instructed and cautioned to not engage in alcohol and or illegal drug use while on prescription medicines. In the event of worsening symptoms, patient is instructed to call the crisis hotline, 911 and or go to the nearest ED for appropriate evaluation and treatment of symptoms. Follow-up with your primary care provider for your other medical issues, concerns and or health care needs.    Total Discharge Time:  Greater than 30 minutes.  Signed: Encarnacion Slates, PMHNP-BC 10/11/2013, 11:19 AM  I personally assessed the patient and formulated the plan Geralyn Flash A. Sabra Heck, M.D.

## 2013-10-11 NOTE — BHH Suicide Risk Assessment (Signed)
Suicide Risk Assessment  Discharge Assessment     Demographic Factors:  NA  Total Time spent with patient: 45 minutes  Psychiatric Specialty Exam:     Blood pressure 120/84, pulse 91, temperature 96.5 F (35.8 C), temperature source Oral, resp. rate 20, height 5\' 2"  (1.575 m), weight 113.399 kg (250 lb).Body mass index is 45.71 kg/(m^2).  General Appearance: Fairly Groomed  Engineer, water::  Fair  Speech:  Clear and Coherent  Volume:  Normal  Mood:  Euthymic  Affect:  Appropriate  Thought Process:  Coherent and Goal Directed  Orientation:  Full (Time, Place, and Person)  Thought Content:  plans as she moves on  Suicidal Thoughts:  No  Homicidal Thoughts:  No  Memory:  Immediate;   Fair Recent;   Fair Remote;   Fair  Judgement:  Fair  Insight:  Fair  Psychomotor Activity:  Normal  Concentration:  Fair  Recall:  [employer] of Knowledge:NA  Language: Fair  Akathisia:  No  Handed:    AIMS (if indicated):     Assets:  Desire for Improvement  Sleep:  Number of Hours: 6.5    Musculoskeletal: Strength & Muscle Tone: within normal limits Gait & Station: normal Patient leans: N/A   Mental Status Per Nursing Assessment::   On Admission:     Current Mental Status by Physician: In full contact with reality. There are no active SI plans or intent. States she is looking forward to getting her apartment and be able to settle in [place]   Loss Factors: NA  Historical Factors: NA  Risk Reduction Factors:   Positive social support  Continued Clinical Symptoms:  Depression:   Insomnia  Cognitive Features That Contribute To Risk:  Closed-mindedness Polarized thinking Thought constriction (tunnel vision)    Suicide Risk:  Minimal: No identifiable suicidal ideation.  Patients presenting with no risk factors but with morbid ruminations; may be classified as minimal risk based on the severity of the depressive symptoms  Discharge Diagnoses:   AXIS I:  Major  Depression recurrent, Generalized Disorder, Social Anxiety Disorder AXIS II:  No diagnosis AXIS III:   Past Medical History  Diagnosis Date  . Anemia   . Depression   . Diabetes mellitus without complication   . Hypertension    AXIS IV:  other psychosocial or environmental problems AXIS V:  61-70 mild symptoms  Plan Of Care/Follow-up recommendations:  Activity:  as tolerated Diet:  regular Follow up [facility] Is patient on multiple antipsychotic therapies at discharge:  No   Has Patient had three or more failed trials of antipsychotic monotherapy by history:  No  Recommended Plan for Multiple Antipsychotic Therapies: NA    Nicholaus Bloom 10/11/2013, 1:54 PM

## 2013-10-16 NOTE — Progress Notes (Signed)
Patient Discharge Instructions:  After Visit Summary (AVS):   Faxed to:  10/16/13 Discharge Summary Note:   Faxed to:  10/16/13 Psychiatric Admission Assessment Note:   Faxed to:  10/16/13 Suicide Risk Assessment - Discharge Assessment:   Faxed to:  10/16/13 Faxed/Sent to the Next Level Care provider:  10/16/13 Faxed to [facility] [place], 10/16/2013, 2:51 PM

## 2014-06-26 DIAGNOSIS — I10 Essential (primary) hypertension: Secondary | ICD-10-CM | POA: Diagnosis not present

## 2014-06-26 DIAGNOSIS — E1165 Type 2 diabetes mellitus with hyperglycemia: Secondary | ICD-10-CM | POA: Diagnosis not present

## 2014-06-26 DIAGNOSIS — E669 Obesity, unspecified: Secondary | ICD-10-CM | POA: Diagnosis not present

## 2014-06-26 DIAGNOSIS — E782 Mixed hyperlipidemia: Secondary | ICD-10-CM | POA: Diagnosis not present

## 2014-07-17 DIAGNOSIS — F329 Major depressive disorder, single episode, unspecified: Secondary | ICD-10-CM | POA: Diagnosis not present

## 2014-08-18 DIAGNOSIS — F333 Major depressive disorder, recurrent, severe with psychotic symptoms: Secondary | ICD-10-CM | POA: Diagnosis not present

## 2014-09-12 ENCOUNTER — Encounter (HOSPITAL_COMMUNITY): Payer: Self-pay | Admitting: *Deleted

## 2014-10-03 DIAGNOSIS — F339 Major depressive disorder, recurrent, unspecified: Secondary | ICD-10-CM | POA: Diagnosis not present

## 2014-10-03 DIAGNOSIS — E1165 Type 2 diabetes mellitus with hyperglycemia: Secondary | ICD-10-CM | POA: Diagnosis not present

## 2014-11-04 ENCOUNTER — Encounter: Payer: Self-pay | Admitting: *Deleted

## 2014-11-07 ENCOUNTER — Ambulatory Visit: Payer: Medicare HMO | Admitting: Internal Medicine

## 2014-11-21 ENCOUNTER — Encounter (HOSPITAL_COMMUNITY): Payer: Self-pay | Admitting: Psychiatry

## 2014-11-21 ENCOUNTER — Ambulatory Visit (INDEPENDENT_AMBULATORY_CARE_PROVIDER_SITE_OTHER): Payer: Medicare HMO | Admitting: Psychiatry

## 2014-11-21 VITALS — BP 126/84 | HR 72 | Ht 62.25 in | Wt 244.0 lb

## 2014-11-21 DIAGNOSIS — F3340 Major depressive disorder, recurrent, in remission, unspecified: Secondary | ICD-10-CM | POA: Diagnosis not present

## 2014-11-21 DIAGNOSIS — F331 Major depressive disorder, recurrent, moderate: Secondary | ICD-10-CM

## 2014-11-21 MED ORDER — DULOXETINE HCL 60 MG PO CPEP: 60.0000 mg | ORAL_CAPSULE | Freq: Every day | ORAL | Status: DC

## 2014-11-21 MED ORDER — QUETIAPINE FUMARATE 100 MG PO TABS: 100.0000 mg | ORAL_TABLET | Freq: Every day | ORAL | Status: DC
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
## 2014-12-16 ENCOUNTER — Telehealth (HOSPITAL_COMMUNITY): Payer: Self-pay

## 2014-12-16 NOTE — Telephone Encounter (Signed)
Medication problem - received a message from [place], not sure if a sister but states patient is too sedated with Seroquel 100mg  and would like to decrease or she may stop it.  Attempted to reach patient to follow up and left a message for her to call back to discuss further and any current symptoms she may be experiencing and informed Dr. Casimiro Needle would be back in the office on 12/17/14.

## 2015-01-02 ENCOUNTER — Encounter: Payer: Self-pay | Admitting: Internal Medicine

## 2015-01-02 ENCOUNTER — Ambulatory Visit (INDEPENDENT_AMBULATORY_CARE_PROVIDER_SITE_OTHER): Payer: Medicare HMO | Admitting: Internal Medicine

## 2015-01-02 VITALS — BP 102/78 | HR 77 | Temp 97.5°F | Resp 18 | Ht 62.0 in | Wt 243.6 lb

## 2015-01-02 DIAGNOSIS — F411 Generalized anxiety disorder: Secondary | ICD-10-CM | POA: Diagnosis not present

## 2015-01-02 DIAGNOSIS — E1140 Type 2 diabetes mellitus with diabetic neuropathy, unspecified: Secondary | ICD-10-CM | POA: Insufficient documentation

## 2015-01-02 DIAGNOSIS — E118 Type 2 diabetes mellitus with unspecified complications: Secondary | ICD-10-CM | POA: Insufficient documentation

## 2015-01-02 DIAGNOSIS — I10 Essential (primary) hypertension: Secondary | ICD-10-CM | POA: Diagnosis not present

## 2015-01-02 DIAGNOSIS — F331 Major depressive disorder, recurrent, moderate: Secondary | ICD-10-CM

## 2015-01-02 DIAGNOSIS — E119 Type 2 diabetes mellitus without complications: Secondary | ICD-10-CM | POA: Diagnosis not present

## 2015-01-02 DIAGNOSIS — R000 Tachycardia, unspecified: Secondary | ICD-10-CM

## 2015-01-02 DIAGNOSIS — D259 Leiomyoma of uterus, unspecified: Secondary | ICD-10-CM | POA: Diagnosis not present

## 2015-01-02 MED ORDER — TETANUS-DIPHTH-ACELL PERTUSSIS 5-2-15.5 LF-MCG/0.5 IM SUSP: 0.5000 mL | Freq: Once | INTRAMUSCULAR | Status: DC

## 2015-01-02 NOTE — Patient Instructions (Addendum)
Continue current medications as ordered  Will call with lab and ultrasound results  Follow up in 1-2 mos CPE  Get eye exam as soon as possible  Get Tetanus vaccine at local pharmacy

## 2015-01-02 NOTE — Progress Notes (Signed)
Patient ID: Lauren Mccormick, female   DOB: 1965-07-26, 49 y.o.   MRN: [id number]    Location:    [facility]    Place of Service:   OFFICE   Advanced Directive information Does patient have an advance directive?: No, Would patient like information on creating an advanced directive?: Yes - Educational materials given  Chief Complaint  Patient presents with  . Establish Care    Establish care  . Medical Management of Chronic Issues    HPI:  49 yo female seen today as anew pt. She is c/a uterine fibroids. She was dx 3 yrs ago while living in [place], [place]. She has intermittent pelvic pain. Her menses is regular. No significant clots or heavy flow. She was not treated for fibroids. She also reports ovarian cyst. She drinks herbal tea which has helped with weakness and SOB  DM - she does not check BS at home on a regular basis but has been fluctuating 110-200s. No low BS reactions. She takes metformin and [place]. Occasional numbness/tingling in feet/hands. Last eye exam in 07/2013.   Depression - she takes cymbalta and seroquel. Mood is stable overall. She sees psych Dr Casimiro Needle with [facility]. She has intermittent paranoia. No hallucinations. She has MDD (recurrent, severe), GAD and social anxiety d/o  HTN - BP stable on lisinopril and atenolol. She does not take ASA daily as she ran out.  Tachycardia - she has frequent palpitations and reports past hx afib. She takes atenolol  Former pt of [facility], Dr Gwenyth Bouillon. She had labs drawn about 2 mos ago.   Past Medical History  Diagnosis Date  . Anemia   . Depression   . Diabetes mellitus without complication   . Hypertension   . Diabetes mellitus, type II   . Tachycardia 2012    Periodic problems with tachycardia with undetermined cause    No past surgical history on file.  Patient Care Team: Gildardo Cranker, DO as PCP - General (Internal Medicine)  Social History   Social History  . Marital Status: Single   Spouse Name: N/A  . Number of Children: N/A  . Years of Education: N/A   Occupational History  . Not on file.   Social History Main Topics  . Smoking status: Never Smoker   . Smokeless tobacco: Never Used  . Alcohol Use: No  . Drug Use: No  . Sexual Activity: Not Currently   Other Topics Concern  . Not on file   Social History Narrative   Diet:    Do you drink/eat things with caffeine? Yes   Marital status: No                              What year were you married?    Do you live in a house, apartment, assisted living, condo, trailer, etc)? Apartment   Is it one or more stories?    How many persons live in your home?    Do you have any pets in your home? Dog   Current or past profession:    Do you exercise?    No                                                Type & how often:  Do you have a living will? No   Do you have a DNR Form?  No                                                                                                                   Do you have a POA/HPOA forms? No     reports that she has never smoked. She has never used smokeless tobacco. She reports that she does not drink alcohol or use illicit drugs.  Family History  Problem Relation Age of Onset  . Hypertension Mother   . Heart disease Father   . Hypertension Father   . Stroke Father   . Alcohol abuse Father   . Diabetes Sister   . Anxiety disorder Brother   . Depression Brother   . Drug abuse Cousin    Family Status  Relation Status Death Age  . Mother Deceased   . Father Deceased   . Sister Alive   . Brother Marketing executive History  Administered Date(s) Administered  . Pneumococcal Polysaccharide-23 10/06/2013    No Known Allergies  Medications: Patient's Medications  New Prescriptions   No medications on file  Previous Medications   ASPIRIN 81 MG CHEWABLE TABLET    Chew 1 tablet (81 mg total) by mouth daily. For heart health   ATENOLOL (TENORMIN) 50 MG TABLET    Take  50 mg by mouth daily.   CARVEDILOL (COREG) 6.25 MG TABLET    Take 1 tablet (6.25 mg total) by mouth 2 (two) times daily. For high blood pressure   COENZYME Q10 (CO Q 10) 100 MG CAPS    Take 100 mg by mouth daily.   DULOXETINE (CYMBALTA) 60 MG CAPSULE    Take 1 capsule (60 mg total) by mouth daily.   ESCITALOPRAM (LEXAPRO) 20 MG TABLET    Take 1 tablet (20 mg total) by mouth daily. For depression   FAMOTIDINE (PEPCID) 20 MG TABLET    Take 1 tablet (20 mg total) by mouth 2 (two) times daily. For acid reflux   LISINOPRIL (PRINIVIL,ZESTRIL) 20 MG TABLET    Take 1 tablet (20 mg total) by mouth daily. For high blood pressure   METFORMIN (GLUCOPHAGE) 500 MG TABLET    Take 1 tablet (500 mg total) by mouth daily with breakfast. For diabetes   MULTIPLE VITAMIN (MULTIVITAMIN WITH MINERALS) TABS TABLET    Take 1 tablet by mouth daily. For low vitamin   QUETIAPINE (SEROQUEL) 100 MG TABLET    Take 1 tablet (100 mg total) by mouth at bedtime. For mood control/sleep   SITAGLIPTIN (JANUVIA) 100 MG TABLET    Take 1 tablet (100 mg total) by mouth daily. For diabetes  Modified Medications   No medications on file  Discontinued Medications   No medications on file    Review of Systems  Constitutional: Positive for fatigue and unexpected weight change (weight loss). Negative for fever, chills, diaphoresis, activity change and appetite change.  HENT: Positive for sinus pressure.  Negative for ear pain and sore throat.   Eyes: Negative for visual disturbance.  Respiratory: Positive for cough and shortness of breath. Negative for chest tightness.   Cardiovascular: Positive for palpitations. Negative for chest pain and leg swelling.  Gastrointestinal: Negative for nausea, vomiting, abdominal pain, diarrhea, constipation and blood in stool.  Genitourinary: Negative for dysuria.  Musculoskeletal: Negative for arthralgias.  Neurological: Positive for weakness. Negative for dizziness, tremors, numbness and headaches.    Psychiatric/Behavioral: Positive for behavioral problems, sleep disturbance and dysphoric mood. The patient is nervous/anxious.     Filed Vitals:   01/02/15 0848  BP: 102/78  Pulse: 77  Temp: 97.5 F (36.4 C)  TempSrc: Oral  Resp: 18  Height: 5\' 2"  (1.575 m)  Weight: 243 lb 9.6 oz (110.496 kg)  SpO2: 97%   Body mass index is 44.54 kg/(m^2).  Physical Exam  Constitutional: She is oriented to person, place, and time. She appears well-developed and well-nourished. No distress.  HENT:  Mouth/Throat: Oropharynx is clear and moist. No oropharyngeal exudate.  Eyes: Pupils are equal, round, and reactive to light. No scleral icterus.  Neck: Neck supple. Carotid bruit is not present. No tracheal deviation present. No thyromegaly present.  Cardiovascular: Normal rate, regular rhythm, normal heart sounds and intact distal pulses.  Exam reveals no gallop and no friction rub.   No murmur heard. +1 pitting LE edema b/l. no calf TTP.   Pulmonary/Chest: Effort normal and breath sounds normal. No stridor. No respiratory distress. She has no wheezes. She has no rales.  Abdominal: Soft. Bowel sounds are normal. She exhibits no distension and no mass. There is no hepatomegaly. There is no tenderness. There is no rebound and no guarding.  Musculoskeletal: She exhibits edema and tenderness.  Lymphadenopathy:    She has no cervical adenopathy.  Neurological: She is alert and oriented to person, place, and time.  Skin: Skin is warm and dry. No rash noted.  Psychiatric: She has a normal mood and affect. Her behavior is normal. Judgment and thought content normal.   Diabetic Foot Exam - Simple   Simple Foot Form  Diabetic Foot exam was performed with the following findings:  Yes 01/02/2015 10:04 AM  Visual Inspection  See comments:  Yes  Sensation Testing  See comments:  Yes  Pulse Check  Posterior Tibialis and Dorsalis pulse intact bilaterally:  Yes  Comments  Reduced monofilament at toes b/l.  Bunions intact. No calluses or ulcerations       Labs reviewed: No visits with results within 3 Month(s) from this visit. Latest known visit with results is:  Admission on 10/05/2013, Discharged on 10/11/2013  Component Date Value Ref Range Status  . Glucose-Capillary 10/06/2013 110* 70 - 99 mg/dL Final  . Glucose-Capillary 10/06/2013 99  70 - 99 mg/dL Final  . Comment 1 10/06/2013 Notify RN   Final  . Glucose-Capillary 10/07/2013 82  70 - 99 mg/dL Final  . Glucose-Capillary 10/07/2013 143* 70 - 99 mg/dL Final  . Glucose-Capillary 10/08/2013 133* 70 - 99 mg/dL Final  . Glucose-Capillary 10/08/2013 100* 70 - 99 mg/dL Final  . Comment 1 10/08/2013 Notify RN   Final  . Glucose-Capillary 10/08/2013 149* 70 - 99 mg/dL Final  . Glucose-Capillary 10/09/2013 163* 70 - 99 mg/dL Final  . Glucose-Capillary 10/09/2013 111* 70 - 99 mg/dL Final  . Glucose-Capillary 10/10/2013 120* 70 - 99 mg/dL Final  . Glucose-Capillary 10/10/2013 121* 70 - 99 mg/dL Final  . Comment 1 10/10/2013 Documented in Chart  Final  . Comment 2 10/10/2013 Notify RN   Final  . Glucose-Capillary 10/11/2013 85  70 - 99 mg/dL Final  . Glucose-Capillary 10/11/2013 75  70 - 99 mg/dL Final    No results found.   Assessment/Plan   ICD-9-CM ICD-10-CM   1. Type 2 diabetes mellitus without complication with hyperglycemia 250.00 E11.9 CMP     Hemoglobin A1c     Lipid Panel     Microalbumin/Creatinine Ratio, Urine     Urinalysis with Reflex Microscopic  2. Essential hypertension, benign - stable 401.1 I10 CMP     CBC with Differential  3. Tachycardia - rate controlled 785.0 R00.0 CBC with Differential     TSH  4. Major depressive disorder, recurrent episode, moderate - stable 296.32 F33.1   5. GAD (generalized anxiety disorder) - stable 300.02 F41.1   6. Uterine leiomyoma, unspecified location with pelvic pain 218.9 D25.9 US Pelvis Complete    Continue current medications as ordered  Will call with lab and ultrasound  results  Follow up in 1-2 mos CPE  Get eye exam as soon as possible  Get Tetanus vaccine at [facility] [address] Perlie Gold  [facility] [address] [place], [place] [address] [phone] Cell (Monday-Friday 8 AM - 5 PM) [phone] After 5 PM and follow prompts

## 2015-01-03 LAB — CBC WITH DIFFERENTIAL/PLATELET
Basophils Absolute: 0 x10E3/uL (ref 0.0–0.2)
Basos: 0 %
EOS (ABSOLUTE): 0.1 x10E3/uL (ref 0.0–0.4)
Eos: 1 %
Hematocrit: 38.2 % (ref 34.0–46.6)
Hemoglobin: 12.7 g/dL (ref 11.1–15.9)
Immature Grans (Abs): 0 x10E3/uL (ref 0.0–0.1)
Immature Granulocytes: 0 %
Lymphocytes Absolute: 2.4 x10E3/uL (ref 0.7–3.1)
Lymphs: 31 %
MCH: 27.9 pg (ref 26.6–33.0)
MCHC: 33.2 g/dL (ref 31.5–35.7)
MCV: 84 fL (ref 79–97)
Monocytes Absolute: 0.4 x10E3/uL (ref 0.1–0.9)
Monocytes: 5 %
Neutrophils Absolute: 4.8 x10E3/uL (ref 1.4–7.0)
Neutrophils: 63 %
Platelets: 357 x10E3/uL (ref 150–379)
RBC: 4.55 x10E6/uL (ref 3.77–5.28)
RDW: 14.1 % (ref 12.3–15.4)
WBC: 7.7 x10E3/uL (ref 3.4–10.8)

## 2015-01-03 LAB — URINALYSIS, ROUTINE W REFLEX MICROSCOPIC
Bilirubin, UA: NEGATIVE
GLUCOSE, UA: NEGATIVE
Ketones, UA: NEGATIVE
Leukocytes, UA: NEGATIVE
NITRITE UA: NEGATIVE
PH UA: 7 (ref 5.0–7.5)
Protein, UA: NEGATIVE
RBC, UA: NEGATIVE
Specific Gravity, UA: 1.012 (ref 1.005–1.030)
UUROB: 0.2 mg/dL (ref 0.2–1.0)

## 2015-01-03 LAB — COMPREHENSIVE METABOLIC PANEL WITH GFR
ALT: 13 IU/L (ref 0–32)
AST: 15 IU/L (ref 0–40)
Albumin/Globulin Ratio: 1.4 (ref 1.1–2.5)
Albumin: 4.3 g/dL (ref 3.5–5.5)
Alkaline Phosphatase: 68 IU/L (ref 39–117)
BUN/Creatinine Ratio: 11 (ref 9–23)
BUN: 8 mg/dL (ref 6–24)
Bilirubin Total: 0.5 mg/dL (ref 0.0–1.2)
CO2: 26 mmol/L (ref 18–29)
Calcium: 9.7 mg/dL (ref 8.7–10.2)
Chloride: 96 mmol/L — ABNORMAL LOW (ref 97–108)
Creatinine, Ser: 0.7 mg/dL (ref 0.57–1.00)
GFR calc Af Amer: 117 mL/min/1.73
GFR calc non Af Amer: 101 mL/min/1.73
Globulin, Total: 3.1 g/dL (ref 1.5–4.5)
Glucose: 117 mg/dL — ABNORMAL HIGH (ref 65–99)
Potassium: 4.5 mmol/L (ref 3.5–5.2)
Sodium: 137 mmol/L (ref 134–144)
Total Protein: 7.4 g/dL (ref 6.0–8.5)

## 2015-01-03 LAB — LIPID PANEL
Chol/HDL Ratio: 3.1 ratio (ref 0.0–4.4)
Cholesterol, Total: 164 mg/dL (ref 100–199)
HDL: 53 mg/dL
LDL Calculated: 88 mg/dL (ref 0–99)
Triglycerides: 114 mg/dL (ref 0–149)
VLDL Cholesterol Cal: 23 mg/dL (ref 5–40)

## 2015-01-03 LAB — TSH: TSH: 2.79 u[IU]/mL (ref 0.450–4.500)

## 2015-01-03 LAB — MICROALBUMIN / CREATININE URINE RATIO
Creatinine, Urine: 99 mg/dL
MICROALB/CREAT RATIO: 3.9 mg/g{creat} (ref 0.0–30.0)
Microalbumin, Urine: 3.9 ug/mL

## 2015-01-03 LAB — HEMOGLOBIN A1C
Est. average glucose Bld gHb Est-mCnc: 148 mg/dL
Hgb A1c MFr Bld: 6.8 % — ABNORMAL HIGH (ref 4.8–5.6)

## 2015-01-07 ENCOUNTER — Other Ambulatory Visit: Payer: Self-pay | Admitting: Internal Medicine

## 2015-01-07 DIAGNOSIS — D259 Leiomyoma of uterus, unspecified: Secondary | ICD-10-CM

## 2015-01-13 ENCOUNTER — Other Ambulatory Visit: Payer: Self-pay

## 2015-01-14 ENCOUNTER — Telehealth: Payer: Self-pay | Admitting: Internal Medicine

## 2015-01-14 NOTE — Telephone Encounter (Signed)
Noted  

## 2015-01-14 NOTE — Telephone Encounter (Signed)
FYI - Patient was a No Show for her US Transvaginal, tried to call the patient and phone has been disconnected

## 2015-01-28 ENCOUNTER — Ambulatory Visit (HOSPITAL_COMMUNITY): Payer: Self-pay | Admitting: Psychiatry

## 2015-02-11 ENCOUNTER — Ambulatory Visit
Admission: RE | Admit: 2015-02-11 | Discharge: 2015-02-11 | Disposition: A | Discharge status: Home / Self Care | Payer: Commercial Managed Care - HMO | Source: Ambulatory Visit | Attending: Internal Medicine | Admitting: Internal Medicine

## 2015-02-11 DIAGNOSIS — D259 Leiomyoma of uterus, unspecified: Secondary | ICD-10-CM

## 2015-02-11 DIAGNOSIS — N83201 Unspecified ovarian cyst, right side: Secondary | ICD-10-CM | POA: Diagnosis not present

## 2015-02-19 ENCOUNTER — Other Ambulatory Visit: Payer: Self-pay

## 2015-02-19 DIAGNOSIS — N838 Other noninflammatory disorders of ovary, fallopian tube and broad ligament: Secondary | ICD-10-CM

## 2015-02-20 ENCOUNTER — Encounter: Payer: Self-pay | Admitting: Internal Medicine

## 2015-02-20 ENCOUNTER — Ambulatory Visit (INDEPENDENT_AMBULATORY_CARE_PROVIDER_SITE_OTHER): Payer: Commercial Managed Care - HMO | Admitting: Internal Medicine

## 2015-02-20 VITALS — BP 124/82 | HR 74 | Temp 98.2°F | Resp 14 | Ht 62.5 in | Wt 236.0 lb

## 2015-02-20 DIAGNOSIS — N839 Noninflammatory disorder of ovary, fallopian tube and broad ligament, unspecified: Secondary | ICD-10-CM

## 2015-02-20 DIAGNOSIS — E119 Type 2 diabetes mellitus without complications: Secondary | ICD-10-CM

## 2015-02-20 DIAGNOSIS — Z1211 Encounter for screening for malignant neoplasm of colon: Secondary | ICD-10-CM | POA: Diagnosis not present

## 2015-02-20 DIAGNOSIS — I10 Essential (primary) hypertension: Secondary | ICD-10-CM | POA: Diagnosis not present

## 2015-02-20 DIAGNOSIS — Z1239 Encounter for other screening for malignant neoplasm of breast: Secondary | ICD-10-CM

## 2015-02-20 DIAGNOSIS — B359 Dermatophytosis, unspecified: Secondary | ICD-10-CM | POA: Diagnosis not present

## 2015-02-20 DIAGNOSIS — R29898 Other symptoms and signs involving the musculoskeletal system: Secondary | ICD-10-CM | POA: Diagnosis not present

## 2015-02-20 DIAGNOSIS — F331 Major depressive disorder, recurrent, moderate: Secondary | ICD-10-CM

## 2015-02-20 DIAGNOSIS — Z124 Encounter for screening for malignant neoplasm of cervix: Secondary | ICD-10-CM | POA: Diagnosis not present

## 2015-02-20 DIAGNOSIS — Z0000 Encounter for general adult medical examination without abnormal findings: Secondary | ICD-10-CM

## 2015-02-20 DIAGNOSIS — N926 Irregular menstruation, unspecified: Secondary | ICD-10-CM

## 2015-02-20 DIAGNOSIS — N838 Other noninflammatory disorders of ovary, fallopian tube and broad ligament: Secondary | ICD-10-CM

## 2015-02-20 DIAGNOSIS — F411 Generalized anxiety disorder: Secondary | ICD-10-CM

## 2015-02-20 MED ORDER — SITAGLIPTIN PHOSPHATE 100 MG PO TABS: 100.0000 mg | ORAL_TABLET | Freq: Every day | ORAL | Status: DC

## 2015-02-20 NOTE — Patient Instructions (Signed)
Encouraged her to exercise 30-45 minutes 4-5 times per week. Eat a well balanced diet. Avoid smoking. Limit alcohol intake. Wear seatbelt when riding in the car. Wear sun block (SPF >50) when spending extended times outside.  Will call with referral appts  Fasting labs 2-3 days prior to appt  Follow up in 3 mos for routine visit

## 2015-02-20 NOTE — Progress Notes (Signed)
Patient ID: Lauren Mccormick, female   DOB: 28-Oct-1965, 49 y.o.   MRN: [id number] Subjective:     Lauren Mccormick is a 49 y.o. female and is here for a comprehensive physical exam. The patient reports problems - darkness under breasts and leg weakness.  She reports 14 yr hx of darkening skin under her breasts. No pain. No rash but has a lot of itching. Pruritus is generalized  She has menses on a monthly basis x 3 yrs. Prior to that, no menses x 2 yrs. She reports irregular menses in adult life. She could go an entire yr without one. She has bloating in abdomen and suprapubic pressure. Legs feel weak at times. FDLMP 02/16/15. Referral for GYN pending  Past Medical History  Diagnosis Date  . Anemia   . Depression   . Diabetes mellitus without complication (HCC)   . Hypertension   . Diabetes mellitus, type II (HCC)   . Tachycardia 2012    Periodic problems with tachycardia with undetermined cause   History reviewed. No pertinent past surgical history. Family History  Problem Relation Age of Onset  . Hypertension Mother   . Heart disease Father   . Hypertension Father   . Stroke Father   . Alcohol abuse Father   . Diabetes Sister   . Anxiety disorder Brother   . Depression Brother   . Drug abuse Cousin     Social History   Social History  . Marital Status: Single    Spouse Name: N/A  . Number of Children: N/A  . Years of Education: N/A   Occupational History  . Not on file.   Social History Main Topics  . Smoking status: Never Smoker   . Smokeless tobacco: Never Used  . Alcohol Use: No  . Drug Use: No  . Sexual Activity: Not Currently   Other Topics Concern  . Not on file   Social History Narrative   Diet:    Do you drink/eat things with caffeine? Yes   Marital status: No                              What year were you married?    Do you live in a house, apartment, assisted living, condo, trailer, etc)? Apartment   Is it one or more stories?    How many persons  live in your home?    Do you have any pets in your home? Dog   Current or past profession:    Do you exercise?    No                                                Type & how often:   Do you have a living will? No   Do you have a DNR Form?  No  Do you have a POA/HPOA forms? No   Health Maintenance  Topic Date Due  . OPHTHALMOLOGY EXAM  05/16/1974  . HIV Screening  05/17/1979  . TETANUS/TDAP  05/17/1983  . PAP SMEAR  05/16/1985  . MAMMOGRAM  05/16/2014  . COLONOSCOPY  05/16/2014  . INFLUENZA VACCINE  12/08/2014  . HEMOGLOBIN A1C  07/05/2015  . FOOT EXAM  01/02/2016  . URINE MICROALBUMIN  01/02/2016  . PNEUMOCOCCAL POLYSACCHARIDE VACCINE (2) 10/07/2018    Review of Systems   Review of Systems  Unable to perform ROS: psychiatric disorder     Objective:      Physical Exam  Constitutional: She is well-developed, well-nourished, and in no distress.  HENT:  Head: Normocephalic and atraumatic.  Right Ear: External ear normal.  Left Ear: External ear normal.  Mouth/Throat: Oropharynx is clear and moist. No oropharyngeal exudate.  Eyes: Conjunctivae and EOM are normal. Pupils are equal, round, and reactive to light. No scleral icterus.  Neck: Normal range of motion. Neck supple. Carotid bruit is not present. No tracheal deviation present. No thyromegaly present.  Cardiovascular: Normal rate, regular rhythm, normal heart sounds and intact distal pulses.  Exam reveals no gallop and no friction rub.   No murmur heard. +1 pitting LE edema b/l. No calf TTP  Pulmonary/Chest: Effort normal and breath sounds normal. She has no wheezes. She has no rhonchi. She has no rales. She exhibits no tenderness. Right breast exhibits no inverted nipple, no mass, no nipple discharge, no skin change and no tenderness. Left breast exhibits no inverted nipple, no mass, no nipple discharge, no skin  change and no tenderness. Breasts are symmetrical.  Abdominal: Soft. Bowel sounds are normal. She exhibits no distension and no mass. There is no hepatosplenomegaly. There is no tenderness. There is no rebound and no guarding.  Genitourinary: Vagina normal, uterus normal, cervix normal, left adnexa normal and vulva normal. Right adnexum displays mass. Right adnexum displays no tenderness. No vaginal discharge found.  Musculoskeletal: Normal range of motion.  Lymphadenopathy:    She has no cervical adenopathy.  Neurological: She is alert. Gait normal.  Skin: Skin is warm and dry. Rash (hyperpigmented tinea, no redness, dry under breasts) noted.  Psychiatric: Affect and judgment normal.      Assessment:    Healthy female exam.       ICD-9-CM ICD-10-CM   1. Well adult exam V70.0 Z00.00   2. Ovarian mass, right 620.9 N83.9    2 cm complex solid and cystic  3. Irregular menses 626.4 N92.6   4. Type 2 diabetes mellitus without complication, without long-term current use of insulin (HCC) 250.00 E11.9 Ambulatory referral to Ophthalmology     sitaGLIPtin (JANUVIA) 100 MG tablet  5. Essential hypertension, benign 401.1 I10   6. Major depressive disorder, recurrent episode, moderate (HCC) 296.32 F33.1   7. GAD (generalized anxiety disorder) 300.02 F41.1   8. Special screening for malignant neoplasms, colon V76.51 Z12.11 Ambulatory referral to Gastroenterology  9. Cervical cancer screening V76.2 Z12.4 PAP, Image Guided [[facility], [facility]]  10. Weakness of both lower extremities 729.89 R29.898   11. Breast cancer screening V76.10 Z12.39 MM DIGITAL SCREENING BILATERAL  12.    Tinea under breasts  Plan:     See After Visit Summary for Counseling Recommendations   Pt is UTD on health maintenance. Vaccinations are UTD. Pt maintains a healthy lifestyle. Encouraged pt to exercise 30-45 minutes 4-5 times per week. Eat a well balanced diet. Avoid smoking. Limit alcohol intake. Wear  seatbelt when  riding in the car. Wear sun block (SPF >50) when spending extended times outside.  Will call with referral appts  Use corn starch under breasts. Avoid applying lotion.   Fasting labs 2-3 days prior to appt (BMP, ALT, A1c, lipid)  Follow up in 3 mos for routine visit  Bobbye Reinitz S. Ancil Linsey  [facility] [address] [place], [place] [address] [phone] Cell (Monday-Friday 8 AM - 5 PM) [phone] After 5 PM and follow prompts

## 2015-02-24 LAB — PAP IG (IMAGE GUIDED): PAP SMEAR COMMENT: 0

## 2015-03-05 ENCOUNTER — Ambulatory Visit (INDEPENDENT_AMBULATORY_CARE_PROVIDER_SITE_OTHER): Payer: Medicare HMO | Admitting: Psychiatry

## 2015-03-05 VITALS — BP 123/83 | HR 94 | Ht 62.0 in | Wt 241.0 lb

## 2015-03-05 DIAGNOSIS — F331 Major depressive disorder, recurrent, moderate: Secondary | ICD-10-CM | POA: Diagnosis not present

## 2015-03-05 MED ORDER — DULOXETINE HCL 60 MG PO CPEP: 60.0000 mg | ORAL_CAPSULE | Freq: Every day | ORAL | Status: DC

## 2015-03-05 NOTE — Progress Notes (Signed)
[facility] MD Progress Note  03/05/2015 3:18 PM Lauren Mccormick  MRN:  [id number] Subjective:  [place] Problem: Major depression, recurrent, remission Diagnosis:  Major depression, recurrent, remission Today the patient is doing very well. Unfortunately she missed her last appointment with me. This is on her second visit. The patient is doing great at [place]. She has 19 other people living are setting and she gets along with most of. The patient's mood is very good. She sleeping and eating well. She's got good energy. She is concentrating well. She is enjoying life. The patient denies the use of alcohol or drugs. She does claim that the Seroquel oversedated her and she herself stopped it. She did increase her Cymbalta to 60 mg without a problem. There is some confusion about her Lexapro injection he has stopped but will stop it now. The patient shows no evidence of psychosis at all. She's not suicidal. She is energetic and happy. Patient denies any chest pain or shortness of breath and she denies any neurological symptoms at this time. Patient Active Problem List   Diagnosis Date Noted  . Diabetes mellitus, type 2 ([place]) [E11.9] 01/02/2015  . Essential hypertension, benign [I10] 01/02/2015  . Tachycardia [R00.0] 01/02/2015  . GAD (generalized anxiety disorder) [F41.1] 10/08/2013  . Social anxiety disorder [F40.10] 10/08/2013  . Major depression ([place]) [F32.9] 10/05/2013  . Suicidal ideations [R45.851] 10/05/2013  . MDD (major depressive disorder), recurrent episode, severe ([place]) [F33.2] 10/05/2013  . Dyspnea [R06.00] 08/12/2013  . Morbid obesity ([place]) [E66.01] 08/12/2013   Total Time spent with patient: 15 minutes  Past Psychiatric History:   Past Medical History:  Past Medical History  Diagnosis Date  . Anemia   . Depression   . Diabetes mellitus without complication ([place])   . Hypertension   . Diabetes mellitus, type II ([place])   . Tachycardia 2012    Periodic problems with  tachycardia with undetermined cause   No past surgical history on file. Family History:  Family History  Problem Relation Age of Onset  . Hypertension Mother   . Heart disease Father   . Hypertension Father   . Stroke Father   . Alcohol abuse Father   . Diabetes Sister   . Anxiety disorder Brother   . Depression Brother   . Drug abuse Cousin    Family Psychiatric  History:  Social History:  History  Alcohol Use No     History  Drug Use No    Social History   Social History  . Marital Status: Single    Spouse Name: N/A  . Number of Children: N/A  . Years of Education: N/A   Social History Main Topics  . Smoking status: Never Smoker   . Smokeless tobacco: Never Used  . Alcohol Use: No  . Drug Use: No  . Sexual Activity: Not Currently   Other Topics Concern  . Not on file   Social History Narrative   Diet:    Do you drink/eat things with caffeine? Yes   Marital status: No                              What year were you married?    Do you live in a house, apartment, assisted living, condo, trailer, etc)? Apartment   Is it one or more stories?    How many persons live in your home?    Do you have any pets in  your home? Dog   Current or past profession:    Do you exercise?    No                                                Type & how often:   Do you have a living will? No   Do you have a DNR Form?  No                                                                                                                   Do you have a POA/HPOA forms? No   Additional Social History:                         Sleep: Good  Appetite:  Good  Current Medications: Current Outpatient Prescriptions  Medication Sig Dispense Refill  . aspirin 81 MG chewable tablet Chew 1 tablet (81 mg total) by mouth daily. For heart health (Patient not taking: Reported on 02/20/2015)    . atenolol (TENORMIN) 50 MG tablet Take 50 mg by mouth daily.    . Coenzyme Q10 (CO Q 10) 100 MG  CAPS Take 100 mg by mouth daily.    . DULoxetine (CYMBALTA) 60 MG capsule Take 1 capsule (60 mg total) by mouth daily. 30 capsule 5  . famotidine (PEPCID) 20 MG tablet Take 1 tablet (20 mg total) by mouth 2 (two) times daily. For acid reflux    . lisinopril (PRINIVIL,ZESTRIL) 20 MG tablet Take 1 tablet (20 mg total) by mouth daily. For high blood pressure    . metFORMIN (GLUCOPHAGE) 500 MG tablet Take 1 tablet (500 mg total) by mouth daily with breakfast. For diabetes 30 tablet 0  . Multiple Vitamin (MULTIVITAMIN WITH MINERALS) TABS tablet Take 1 tablet by mouth daily. For low vitamin    . sitaGLIPtin (JANUVIA) 100 MG tablet Take 1 tablet (100 mg total) by mouth daily. For diabetes 90 tablet 1  . Tdap (ADACEL) 09-07-13.5 LF-MCG/0.5 injection Inject 0.5 mLs into the muscle once. (Patient not taking: Reported on 02/20/2015) 0.5 mL 0   No current facility-administered medications for this visit.    Lab Results: No results found for this or any previous visit (from the past 48 hour(s)).  Physical Findings: AIMS:  , ,  ,  ,    CIWA:    COWS:     Musculoskeletal: Strength & Muscle Tone: within normal limits Gait & Station: normal Patient leans: Right  Psychiatric Specialty Exam: ROS  There were no vitals taken for this visit.There is no weight on file to calculate BMI.  General Appearance: Casual  Eye Contact::  Good  Speech:  Clear and Coherent  Volume:  Normal  Mood:  Euthymic  Affect:  Negative  Thought Process:  Coherent  Orientation:  Full (Time, Place, and Person)  Thought Content:  WDL  Suicidal Thoughts:  No  Homicidal Thoughts:  No  Memory:  NA  Judgement:  Good  Insight:  Good  Psychomotor Activity:  Normal  Concentration:  NA  Recall:  Good  Fund of Knowledge:Good  Language: Good  Akathisia:  No  Handed:  Right  AIMS (if indicated):     Assets:  Communication Skills  ADL's:  Intact  Cognition: WNL  Sleep:      Treatment Plan Summary: At this time the patient  is doing very well. She is taking Lexapro and Cymbalta will change that. She should discontinue her Lexapro but continue taking Cymbalta 60 mg. She herself stopped her Seroquel which is okay with me. The patient is very stable this time. She's been you good adjustment to the [place]. Her father is deceased but her mother still alive and lives in [place] and she is contact with her relatively regularly. The patients in good spirits does not use any drugs or alcohol. This patient to return to see me in 5 months.  Kimani Bedoya, Doctor [facility] 03/05/2015, 3:18 PM

## 2015-03-09 ENCOUNTER — Ambulatory Visit: Payer: Commercial Managed Care - HMO | Admitting: Obstetrics

## 2015-03-12 ENCOUNTER — Ambulatory Visit: Payer: Commercial Managed Care - HMO | Attending: Gynecology | Admitting: Gynecology

## 2015-03-12 ENCOUNTER — Encounter: Payer: Self-pay | Admitting: Gynecology

## 2015-03-12 VITALS — BP 131/82 | HR 80 | Temp 98.8°F | Resp 18 | Ht 62.0 in | Wt 238.2 lb

## 2015-03-12 DIAGNOSIS — R1900 Intra-abdominal and pelvic swelling, mass and lump, unspecified site: Secondary | ICD-10-CM | POA: Diagnosis not present

## 2015-03-12 NOTE — Progress Notes (Signed)
Consult Note: Gyn-Onc   Lauren Mccormick 49 y.o. female  Chief Complaint  Patient presents with  . 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      HPI: 49 year old Afro-American female seen in consultation at the request of Dr. Gildardo Cranker regarding management of a newly diagnosed right ovarian mass. The patient was initially seen for comprehensive physical exam on 02/20/2015. Prior to that visit the patient had had a pelvic ultrasound performed secondary to complaints of pelvic pain and pressure for 17 years. The patient had previously been told that she had fibroids. She continues to have monthly menstrual periods. She has never been pregnant. The pelvic symptoms are those of pressure but not extreme pain. They're not cyclic and there is no apparent triggers when asking the question. Patient had a normal Pap smear recently. On ultrasound the uterus was small with no fibroids. The right ovary measured 4 x 2.3 x 3.3 cm with a 2.0 x 1.3 x 1.4 cm solid and cystic lesion of the right ovary.  Patient denies any family history of breast ovarian or endometrial or colon cancer. She denies any past gynecologic history.  Review of Systems:10 point review of systems is negative except as noted in interval history.   Vitals: Blood pressure 131/82, pulse 80, temperature 98.8 F (37.1 C), temperature source Oral, resp. rate 18, height 5\' 2"  (1.575 m), weight 238 lb 3 oz (108.041 kg).  Physical Exam: General : The patient is a healthy obese woman in no acute distress.  HEENT: normocephalic, extraoccular movements normal; neck is supple without thyromegally  Lynphnodes: Supraclavicular and inguinal nodes not enlarged  Abdomen: Soft, obese, non-tender, no ascites, no organomegally, no masses, no  hernias  Pelvic:  EGBUS: Normal female  Vagina: Normal, no lesions  Urethra and Bladder: Normal, non-tender  Cervix: Normal Uterus: Difficult to evaluate secondary to the patient's habitus. Bi-manual examination: Non-tender; no adenxal masses or nodularity  Rectal: normal sphincter tone, no masses, no blood  Lower extremities: No edema or varicosities. Normal range of motion      No Known Allergies  Past Medical History  Diagnosis Date  . Anemia   . Depression   . Diabetes mellitus without complication ([place])   . Hypertension   . Diabetes mellitus, type II ([place])   . Tachycardia 2012    Periodic problems with tachycardia with undetermined cause    History reviewed. No pertinent past surgical history.  Current Outpatient Prescriptions  Medication Sig Dispense Refill  . aspirin 81 MG chewable tablet Chew 1 tablet (81 mg total) by mouth daily. For heart health    . atenolol (TENORMIN) 50 MG tablet Take 50 mg by mouth daily.    . DULoxetine (CYMBALTA) 60 MG capsule Take 1 capsule (60 mg total) by mouth daily. 30 capsule 5  . lisinopril (PRINIVIL,ZESTRIL) 20 MG tablet Take 1 tablet (20 mg total) by mouth daily. For high blood pressure    . metFORMIN (GLUCOPHAGE) 500 MG tablet Take 1 tablet (500 mg total) by mouth daily with breakfast. For diabetes 30 tablet 0  . Multiple Vitamin (MULTIVITAMIN WITH MINERALS) TABS tablet Take 1 tablet by mouth daily. For low vitamin    . QUEtiapine (SEROQUEL) 100 MG tablet TAKE ONE TABLET  BY MOUTH AT BEDTIME FOR mood.  0  . sitaGLIPtin (JANUVIA) 100 MG tablet Take 1 tablet (100 mg total) by mouth daily. For diabetes 90 tablet 1  . Coenzyme Q10 (CO Q 10) 100 MG CAPS Take 100 mg by mouth daily.    . famotidine (PEPCID) 20 MG tablet Take 1 tablet (20 mg total) by mouth 2 (two) times daily. For acid reflux (Patient not taking: Reported on 03/12/2015)    . Tdap (ADACEL) 09-07-13.5 LF-MCG/0.5 injection Inject 0.5 mLs into the muscle once. (Patient not taking:  Reported on 02/20/2015) 0.5 mL 0   No current facility-administered medications for this visit.    Social History   Social History  . Marital Status: Single    Spouse Name: N/A  . Number of Children: N/A  . Years of Education: N/A   Occupational History  . Not on file.   Social History Main Topics  . Smoking status: Never Smoker   . Smokeless tobacco: Never Used  . Alcohol Use: No  . Drug Use: No  . Sexual Activity: Not Currently   Other Topics Concern  . Not on file   Social History Narrative   Diet:    Do you drink/eat things with caffeine? Yes   Marital status: No                              What year were you married?    Do you live in a house, apartment, assisted living, condo, trailer, etc)? Apartment   Is it one or more stories?    How many persons live in your home?    Do you have any pets in your home? Dog   Current or past profession:    Do you exercise?    No                                                Type & how often:   Do you have a living will? No   Do you have a DNR Form?  No                                                                                                                   Do you have a POA/HPOA forms? No    Family History  Problem Relation Age of Onset  . Hypertension Mother   . Heart disease Father   . Hypertension Father   . Stroke Father   . Alcohol abuse Father   . Diabetes Sister   . Anxiety disorder Brother   . Depression Brother   . Drug abuse Cousin       Alvino Chapel, MD 03/12/2015, 10:15 AM

## 2015-03-12 NOTE — Patient Instructions (Signed)
Plan for repeat ultrasound in three months with an appt to see Dr. Fermin Schwab after.  Please call for any questions or concerns.

## 2015-03-14 ENCOUNTER — Other Ambulatory Visit: Payer: Self-pay

## 2015-03-14 DIAGNOSIS — Z1231 Encounter for screening mammogram for malignant neoplasm of breast: Secondary | ICD-10-CM

## 2015-03-27 ENCOUNTER — Encounter: Payer: Self-pay | Admitting: Internal Medicine

## 2015-04-09 ENCOUNTER — Other Ambulatory Visit: Payer: Self-pay | Admitting: *Deleted

## 2015-04-09 MED ORDER — ATENOLOL 50 MG PO TABS: 50.0000 mg | ORAL_TABLET | Freq: Every day | ORAL | Status: DC

## 2015-04-09 MED ORDER — QUETIAPINE FUMARATE 100 MG PO TABS: ORAL_TABLET | ORAL | Status: DC

## 2015-04-09 MED ORDER — LISINOPRIL 20 MG PO TABS: 20.0000 mg | ORAL_TABLET | Freq: Every day | ORAL | Status: DC

## 2015-04-09 NOTE — Telephone Encounter (Signed)
[facility] 

## 2015-05-07 ENCOUNTER — Ambulatory Visit (AMBULATORY_SURGERY_CENTER): Payer: Self-pay

## 2015-05-07 VITALS — Ht 62.0 in | Wt 244.0 lb

## 2015-05-07 DIAGNOSIS — Z1211 Encounter for screening for malignant neoplasm of colon: Secondary | ICD-10-CM

## 2015-05-07 MED ORDER — SUPREP BOWEL PREP KIT 17.5-3.13-1.6 GM/177ML PO SOLN: 1.0000 | Freq: Once | ORAL | Status: DC

## 2015-05-07 NOTE — Progress Notes (Signed)
No allergies to eggs or soy No diet/weight loss med No home oxygen No past problems with anesthesia  No email and internet

## 2015-05-13 ENCOUNTER — Ambulatory Visit: Payer: Self-pay

## 2015-05-20 ENCOUNTER — Other Ambulatory Visit: Payer: Self-pay | Admitting: *Deleted

## 2015-05-20 MED ORDER — METFORMIN HCL 500 MG PO TABS: 500.0000 mg | ORAL_TABLET | Freq: Every day | ORAL | Status: DC

## 2015-05-20 NOTE — Telephone Encounter (Signed)
[place] Requested

## 2015-05-27 ENCOUNTER — Other Ambulatory Visit: Payer: Commercial Managed Care - HMO

## 2015-05-27 DIAGNOSIS — F331 Major depressive disorder, recurrent, moderate: Secondary | ICD-10-CM

## 2015-05-27 DIAGNOSIS — I10 Essential (primary) hypertension: Secondary | ICD-10-CM

## 2015-05-27 DIAGNOSIS — F411 Generalized anxiety disorder: Secondary | ICD-10-CM

## 2015-05-27 DIAGNOSIS — E119 Type 2 diabetes mellitus without complications: Secondary | ICD-10-CM | POA: Diagnosis not present

## 2015-05-28 LAB — BASIC METABOLIC PANEL
BUN/Creatinine Ratio: 14 (ref 9–23)
BUN: 9 mg/dL (ref 6–24)
CO2: 22 mmol/L (ref 18–29)
Calcium: 9.4 mg/dL (ref 8.7–10.2)
Chloride: 100 mmol/L (ref 96–106)
Creatinine, Ser: 0.66 mg/dL (ref 0.57–1.00)
GFR, EST AFRICAN AMERICAN: 118 mL/min/{1.73_m2} (ref >59)
GFR, EST NON AFRICAN AMERICAN: 103 mL/min/{1.73_m2} (ref >59)
Glucose: 155 mg/dL — ABNORMAL HIGH (ref 65–99)
POTASSIUM: 3.7 mmol/L (ref 3.5–5.2)
SODIUM: 138 mmol/L (ref 134–144)

## 2015-05-28 LAB — LIPID PANEL
CHOL/HDL RATIO: 2.6 ratio (ref 0.0–4.4)
Cholesterol, Total: 157 mg/dL (ref 100–199)
HDL: 61 mg/dL (ref >39)
LDL CALC: 70 mg/dL (ref 0–99)
TRIGLYCERIDES: 131 mg/dL (ref 0–149)
VLDL Cholesterol Cal: 26 mg/dL (ref 5–40)

## 2015-05-28 LAB — HEMOGLOBIN A1C
Est. average glucose Bld gHb Est-mCnc: 151 mg/dL
Hgb A1c MFr Bld: 6.9 % — ABNORMAL HIGH (ref 4.8–5.6)

## 2015-05-28 LAB — ALT: ALT: 12 IU/L (ref 0–32)

## 2015-05-29 ENCOUNTER — Encounter: Payer: Self-pay | Admitting: Internal Medicine

## 2015-05-29 ENCOUNTER — Ambulatory Visit (INDEPENDENT_AMBULATORY_CARE_PROVIDER_SITE_OTHER): Payer: Commercial Managed Care - HMO | Admitting: Internal Medicine

## 2015-05-29 VITALS — BP 132/90 | HR 86 | Temp 97.7°F | Resp 18 | Ht 62.0 in | Wt 248.2 lb

## 2015-05-29 DIAGNOSIS — M545 Low back pain, unspecified: Secondary | ICD-10-CM

## 2015-05-29 DIAGNOSIS — F331 Major depressive disorder, recurrent, moderate: Secondary | ICD-10-CM | POA: Diagnosis not present

## 2015-05-29 DIAGNOSIS — M25461 Effusion, right knee: Secondary | ICD-10-CM

## 2015-05-29 DIAGNOSIS — F411 Generalized anxiety disorder: Secondary | ICD-10-CM

## 2015-05-29 DIAGNOSIS — E119 Type 2 diabetes mellitus without complications: Secondary | ICD-10-CM | POA: Diagnosis not present

## 2015-05-29 DIAGNOSIS — I10 Essential (primary) hypertension: Secondary | ICD-10-CM | POA: Diagnosis not present

## 2015-05-29 DIAGNOSIS — N839 Noninflammatory disorder of ovary, fallopian tube and broad ligament, unspecified: Secondary | ICD-10-CM | POA: Diagnosis not present

## 2015-05-29 DIAGNOSIS — N838 Other noninflammatory disorders of ovary, fallopian tube and broad ligament: Secondary | ICD-10-CM

## 2015-05-29 NOTE — Progress Notes (Signed)
Patient ID: Lauren Mccormick, female   DOB: 02/01/1966, 50 y.o.   MRN: [id number]    Location:    [facility]   Place of Service:   OFFICE  Chief Complaint  Patient presents with  . Medical Management of Chronic Issues    3 month follow-up for Hypertension, DM    HPI:  50 yo female seen today for f/u.   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 No signs of malignancy and was told it is likely benign. She will have a repeat pelvic US next month.  DM - she does not check BS at home on a regular basis but has been fluctuating 110-200s. No low BS reactions. She takes metformin and [place]. Occasional numbness/tingling in feet/hands. Last eye exam in 07/2013. She does not have vision insurance yet   Depression - she takes cymbalta and seroquel. Mood is stable overall. She sees psych Dr Casimiro Needle with [facility] [place]. She has intermittent paranoia. No hallucinations. She has MDD (recurrent, severe), GAD and social anxiety d/o  HTN - BP stable on lisinopril and atenolol. She does not take ASA daily as she ran out.  Tachycardia - she has frequent palpitations and reports past hx afib. She takes atenolol  She c/o right leg weakness that prevents her walking long distances without stopping to rest. No recent falls. She has low back pain. Occasional numbness in legs/feet. No loss of bowel/bladder control   Past Medical History  Diagnosis Date  . Anemia   . Depression   . Diabetes mellitus without complication ([place])   . Hypertension   . Diabetes mellitus, type II ([place])   . Tachycardia 2012    Periodic problems with tachycardia with undetermined cause    Past Surgical History  Procedure Laterality Date  . Bean in ears      childhood    Patient Care Team: Gildardo Cranker, DO as PCP - General (Internal Medicine)  Social History   Social History  . Marital Status: Single    Spouse Name: N/A  . Number of Children: N/A  . Years of Education:  N/A   Occupational History  . Not on file.   Social History Main Topics  . Smoking status: Never Smoker   . Smokeless tobacco: Never Used  . Alcohol Use: No  . Drug Use: No  . Sexual Activity: Not Currently   Other Topics Concern  . Not on file   Social History Narrative   Diet:    Do you drink/eat things with caffeine? Yes   Marital status: No                              What year were you married?    Do you live in a house, apartment, assisted living, condo, trailer, etc)? Apartment   Is it one or more stories?    How many persons live in your home?    Do you have any pets in your home? Dog   Current or past profession:    Do you exercise?    No                                                Type & how often:   Do you have a  living will? No   Do you have a DNR Form?  No                                                                                                                   Do you have a POA/HPOA forms? No     reports that she has never smoked. She has never used smokeless tobacco. She reports that she does not drink alcohol or use illicit drugs.  No Known Allergies  Medications: Patient's Medications  New Prescriptions   No medications on file  Previous Medications   ASPIRIN 81 MG CHEWABLE TABLET    Chew 1 tablet (81 mg total) by mouth daily. For heart health   ATENOLOL (TENORMIN) 50 MG TABLET    Take 1 tablet (50 mg total) by mouth daily.   COENZYME Q10 (CO Q 10) 100 MG CAPS    Take 100 mg by mouth daily.   DULOXETINE (CYMBALTA) 60 MG CAPSULE    Take 1 capsule (60 mg total) by mouth daily.   FAMOTIDINE (PEPCID) 20 MG TABLET    Take 1 tablet (20 mg total) by mouth 2 (two) times daily. For acid reflux   LISINOPRIL (PRINIVIL,ZESTRIL) 20 MG TABLET    Take 1 tablet (20 mg total) by mouth daily. For high blood pressure   METFORMIN (GLUCOPHAGE) 500 MG TABLET    Take 1 tablet (500 mg total) by mouth daily with breakfast. For diabetes   MULTIPLE VITAMIN (MULTIVITAMIN  WITH MINERALS) TABS TABLET    Take 1 tablet by mouth daily. For low vitamin   QUETIAPINE (SEROQUEL) 100 MG TABLET    Take one tablet by mouth at bedtime for mood   SITAGLIPTIN (JANUVIA) 100 MG TABLET    Take 1 tablet (100 mg total) by mouth daily. For diabetes   SUPREP BOWEL PREP SOLN    Take 1 kit by mouth once.   TDAP (ADACEL) 09-07-13.5 LF-MCG/0.5 INJECTION    Inject 0.5 mLs into the muscle once.  Modified Medications   No medications on file  Discontinued Medications   No medications on file    Review of Systems  Constitutional: Negative for fever, chills, diaphoresis, activity change, appetite change and fatigue.  HENT: Negative for ear pain and sore throat.   Eyes: Negative for visual disturbance.  Respiratory: Negative for cough, chest tightness and shortness of breath.   Cardiovascular: Positive for palpitations. Negative for chest pain and leg swelling.  Gastrointestinal: Negative for nausea, vomiting, abdominal pain, diarrhea, constipation and blood in stool.  Genitourinary: Negative for dysuria.  Musculoskeletal: Negative for arthralgias.  Neurological: Positive for weakness and numbness. Negative for dizziness, tremors and headaches.  Psychiatric/Behavioral: Positive for dysphoric mood. Negative for sleep disturbance. The patient is nervous/anxious.     Filed Vitals:   05/29/15 1355  BP: 132/90  Pulse: 86  Temp: 97.7 F (36.5 C)  TempSrc: Oral  Resp: 18  Height: _0  (1.575 m)  Weight: 248 lb 3.2 oz (112.583 kg)  SpO2: 97%  Body mass index is 45.38 kg/(m^2).  Physical Exam  Constitutional: She appears well-developed and well-nourished.  HENT:  Mouth/Throat: Oropharynx is clear and moist. No oropharyngeal exudate.  Eyes: Pupils are equal, round, and reactive to light. No scleral icterus.  Neck: Neck supple. Carotid bruit is not present. No tracheal deviation present. No thyromegaly present.  Cardiovascular: Normal rate, regular rhythm, normal heart sounds and  intact distal pulses.  Exam reveals no gallop and no friction rub.   No murmur heard. +1 pitting RLE edema. No calf TTP b/l. No LLE edema  Pulmonary/Chest: Effort normal and breath sounds normal. No stridor. No respiratory distress. She has no wheezes. She has no rales.  Abdominal: Soft. Bowel sounds are normal. She exhibits no distension and no mass. There is no hepatomegaly. There is no tenderness. There is no rebound and no guarding.  Musculoskeletal: She exhibits edema and tenderness.  R>L knee crepitus and swelling;  Neg SLR; no short leg; paravertebral lumbar muscle hypertrophy with ropy tissue texture changes; increased lumbar lordosis  Lymphadenopathy:    She has no cervical adenopathy.  Neurological: She is alert.  Skin: Skin is warm and dry. No rash noted.     Psychiatric: She has a normal mood and affect. Her behavior is normal. Thought content normal.     Labs reviewed: Appointment on 05/27/2015  Component Date Value Ref Range Status  . Glucose 05/27/2015 155* 65 - 99 mg/dL Final  . BUN 05/27/2015 9  6 - 24 mg/dL Final  . Creatinine, Ser 05/27/2015 0.66  0.57 - 1.00 mg/dL Final  . GFR calc non Af Amer 05/27/2015 103  >59 mL/min/1.73 Final  . GFR calc Af Amer 05/27/2015 118  >59 mL/min/1.73 Final  . BUN/Creatinine Ratio 05/27/2015 14  9 - 23 Final  . Sodium 05/27/2015 138  134 - 144 mmol/L Final  . Potassium 05/27/2015 3.7  3.5 - 5.2 mmol/L Final  . Chloride 05/27/2015 100  96 - 106 mmol/L Final  . CO2 05/27/2015 22  18 - 29 mmol/L Final  . Calcium 05/27/2015 9.4  8.7 - 10.2 mg/dL Final  . ALT 05/27/2015 12  0 - 32 IU/L Final  . Cholesterol, Total 05/27/2015 157  100 - 199 mg/dL Final  . Triglycerides 05/27/2015 131  0 - 149 mg/dL Final  . HDL 05/27/2015 61  >39 mg/dL Final  . VLDL Cholesterol Cal 05/27/2015 26  5 - 40 mg/dL Final  . LDL Calculated 05/27/2015 70  0 - 99 mg/dL Final  . Chol/HDL Ratio 05/27/2015 2.6  0.0 - 4.4 ratio units Final   Comment:                                    T. Chol/HDL Ratio                                             Men  Women                               1/2 Avg.Risk  3.4    3.3  Avg.Risk  5.0    4.4                                2X Avg.Risk  9.6    7.1                                3X Avg.Risk 23.4   11.0   . Hgb A1c MFr Bld 05/27/2015 6.9* 4.8 - 5.6 % Final   Comment:          Pre-diabetes: 5.7 - 6.4          Diabetes: >6.4          Glycemic control for adults with diabetes: <7.0   . Est. average glucose Bld gHb Est-m* 05/27/2015 151   Final    No results found.   Assessment/Plan   ICD-9-CM ICD-10-CM   1. Swelling of knee joint, right 719.06 M25.461 DG Knee Complete 4 Views Right  2. Right-sided low back pain without sciatica 724.2 M54.5 DG Lumbar Spine Complete  3. Essential hypertension, benign 401.1 I10   4. Type 2 diabetes mellitus without complication, without long-term current use of insulin (HCC) 250.00 E11.9   5. Major depressive disorder, recurrent episode, moderate (HCC) 296.32 F33.1   6. GAD (generalized anxiety disorder) 300.02 F41.1   7. Ovarian mass, right 620.9 N83.9     Will call with xray results. May need Ortho eval  Continue current medications as ordered  Reduce salt intake. Increase exercise  F/u with GYN as scheduled  Follow up in 4 mos for routine visit. Fasting labs prior to appt   Lolo S. Perlie Gold  [facility] [address] [place], [place] [address] [phone] Cell (Monday-Friday 8 AM - 5 PM) [phone] After 5 PM and follow prompts

## 2015-05-29 NOTE — Patient Instructions (Signed)
Will call with xray results. May need Ortho eval  Continue current medications as ordered  Reduce salt intake. Increase exercise  Follow up in 4 mos for routine visit. Fasting labs prior to appt

## 2015-06-03 ENCOUNTER — Ambulatory Visit
Admission: RE | Admit: 2015-06-03 | Discharge: 2015-06-03 | Disposition: A | Discharge status: Home / Self Care | Payer: Commercial Managed Care - HMO | Source: Ambulatory Visit

## 2015-06-03 DIAGNOSIS — Z1231 Encounter for screening mammogram for malignant neoplasm of breast: Secondary | ICD-10-CM | POA: Diagnosis not present

## 2015-06-04 ENCOUNTER — Ambulatory Visit (AMBULATORY_SURGERY_CENTER): Payer: Commercial Managed Care - HMO | Admitting: Internal Medicine

## 2015-06-04 ENCOUNTER — Encounter: Payer: Self-pay | Admitting: Internal Medicine

## 2015-06-04 VITALS — BP 130/72 | HR 90 | Temp 98.6°F | Resp 29 | Ht 62.0 in | Wt 244.0 lb

## 2015-06-04 DIAGNOSIS — K219 Gastro-esophageal reflux disease without esophagitis: Secondary | ICD-10-CM | POA: Diagnosis not present

## 2015-06-04 DIAGNOSIS — F329 Major depressive disorder, single episode, unspecified: Secondary | ICD-10-CM | POA: Diagnosis not present

## 2015-06-04 DIAGNOSIS — Z1211 Encounter for screening for malignant neoplasm of colon: Secondary | ICD-10-CM

## 2015-06-04 DIAGNOSIS — D123 Benign neoplasm of transverse colon: Secondary | ICD-10-CM

## 2015-06-04 DIAGNOSIS — I10 Essential (primary) hypertension: Secondary | ICD-10-CM | POA: Diagnosis not present

## 2015-06-04 DIAGNOSIS — E119 Type 2 diabetes mellitus without complications: Secondary | ICD-10-CM | POA: Diagnosis not present

## 2015-06-04 MED ORDER — SODIUM CHLORIDE 0.9 % IV SOLN: 500.0000 mL | INTRAVENOUS | Status: DC

## 2015-06-04 NOTE — Progress Notes (Signed)
Called to room to assist during endoscopic procedure.  Patient ID and intended procedure confirmed with present staff. Received instructions for my participation in the procedure from the performing physician.  

## 2015-06-04 NOTE — Progress Notes (Signed)
Report to PACU, RN, vss, BBS= Clear.  

## 2015-06-04 NOTE — Op Note (Signed)
[place]  [address] [place], [id number]   COLONOSCOPY PROCEDURE REPORT  PATIENT: Lauren Mccormick, Lauren Mccormick  MR#: [id number] BIRTHDATE: 14-May-1965 , 50  yrs. old GENDER: female ENDOSCOPIST: Jerene Bears, MD REFERRED BY: Gildardo Cranker PROCEDURE DATE:  06/04/2015 PROCEDURE:   Colonoscopy, screening and Colonoscopy with cold biopsy polypectomy First Screening Colonoscopy - Avg.  risk and is 50 yrs.  old or older Yes.  Prior Negative Screening - Now for repeat screening. N/A  History of Adenoma - Now for follow-up colonoscopy & has been > or = to 3 yrs.  N/A  Polyps removed today? Yes ASA CLASS:   Class III INDICATIONS:Screening for colonic neoplasia, Colorectal Neoplasm Risk Assessment for this procedure is average risk, and 1st colonoscopy. MEDICATIONS: Monitored anesthesia care and Propofol 300 mg IV  DESCRIPTION OF PROCEDURE:   After the risks benefits and alternatives of the procedure were thoroughly explained, informed consent was obtained.  The digital rectal exam revealed no rectal mass.   The LB [id number]  endoscope was introduced through the anus and advanced to the cecum, which was identified by both the appendix and ileocecal valve. No adverse events experienced. The quality of the prep was good.  (Suprep was used)  The instrument was then slowly withdrawn as the colon was fully examined. Estimated blood loss is zero unless otherwise noted in this procedure report.   COLON FINDINGS: A sessile polyp measuring 4 mm in size was found in the transverse colon.  A polypectomy was performed with cold forceps.  The resection was complete, the polyp tissue was completely retrieved and sent to histology.   There was mild diverticulosis noted in the sigmoid colon and descending colon. Retroflexed views revealed internal hemorrhoids. The time to cecum = 3.3 Withdrawal time = 10.0   The scope was withdrawn and the procedure completed. COMPLICATIONS: There  were no immediate complications.  ENDOSCOPIC IMPRESSION: 1.   Sessile polyp was found in the transverse colon; polypectomy was performed with cold forceps 2.   Mild diverticulosis was noted in the sigmoid colon and descending colon  RECOMMENDATIONS: 1.  Await pathology results 2.  High fiber diet 3.  If the polyp removed today is proven to be an adenomatous (pre-cancerous) polyp, you will need a repeat colonoscopy in 5 years.  Otherwise you should continue to follow colorectal cancer screening guidelines for "routine risk" patients with colonoscopy in 10 years.  You will receive a letter within 1-2 weeks with the results of your biopsy as well as final recommendations.  Please call my office if you have not received a letter after 3 weeks.  eSigned:  Jerene Bears, MD 06/04/2015 10:04 AM  cc:  The patient, Gildardo Cranker [

## 2015-06-04 NOTE — Patient Instructions (Signed)
YOU HAD AN ENDOSCOPIC PROCEDURE TODAY AT [facility]:   Refer to the procedure report that was given to you for any specific questions about what was found during the examination.  If the procedure report does not answer your questions, please call your gastroenterologist to clarify.  If you requested that your care partner not be given the details of your procedure findings, then the procedure report has been included in a sealed envelope for you to review at your convenience later.  YOU SHOULD EXPECT: Some feelings of bloating in the abdomen. Passage of more gas than usual.  Walking can help get rid of the air that was put into your GI tract during the procedure and reduce the bloating. If you had a lower endoscopy (such as a colonoscopy or flexible sigmoidoscopy) you may notice spotting of blood in your stool or on the toilet paper. If you underwent a bowel prep for your procedure, you may not have a normal bowel movement for a few days.  Please Note:  You might notice some irritation and congestion in your nose or some drainage.  This is from the oxygen used during your procedure.  There is no need for concern and it should clear up in a day or so.  SYMPTOMS TO REPORT IMMEDIATELY:   Following lower endoscopy (colonoscopy or flexible sigmoidoscopy):  Excessive amounts of blood in the stool  Significant tenderness or worsening of abdominal pains  Swelling of the abdomen that is new, acute  Fever of 100F or higher  For urgent or emergent issues, a gastroenterologist can be reached at any hour by calling [phone].   DIET: Your first meal following the procedure should be a small meal and then it is ok to progress to your normal diet. Heavy or fried foods are harder to digest and may make you feel nauseous or bloated.  Likewise, meals heavy in dairy and vegetables can increase bloating.  Drink plenty of fluids but you should avoid alcoholic beverages for 24  hours.  ACTIVITY:  You should plan to take it easy for the rest of today and you should NOT DRIVE or use heavy machinery until tomorrow (because of the sedation medicines used during the test).    FOLLOW UP: Our staff will call the number listed on your records the next business day following your procedure to check on you and address any questions or concerns that you may have regarding the information given to you following your procedure. If we do not reach you, we will leave a message.  However, if you are feeling well and you are not experiencing any problems, there is no need to return our call.  We will assume that you have returned to your regular daily activities without incident.  If any biopsies were taken you will be contacted by phone or by letter within the next 1-3 weeks.  Please call us at [phone] if you have not heard about the biopsies in 3 weeks.    SIGNATURES/CONFIDENTIALITY: You and/or your care partner have signed paperwork which will be entered into your electronic medical record.  These signatures attest to the fact that that the information above on your After Visit Summary has been reviewed and is understood.  Full responsibility of the confidentiality of this discharge information lies with you and/or your care-partner.  Please continue your normal medications  Please read over handouts about polyps, diverticulosis, and high fiber diets

## 2015-06-04 NOTE — Progress Notes (Signed)
Pt passes small amt of air while in RR.  Abdomen soft, easily palpable.  No c/o pain at discharge

## 2015-06-05 ENCOUNTER — Telehealth: Payer: Self-pay

## 2015-06-05 NOTE — Telephone Encounter (Signed)
  Follow up Call-  Call back number 06/04/2015  Post procedure Call Back phone  # [phone]  Permission to leave phone message Yes    Patient was called for follow up after procedure on 06/04/2015. Patient answered the phone but didn't speak, so I was not able to leave a message.

## 2015-06-10 ENCOUNTER — Encounter: Payer: Self-pay | Admitting: Internal Medicine

## 2015-06-22 ENCOUNTER — Ambulatory Visit (HOSPITAL_COMMUNITY)
Admission: RE | Admit: 2015-06-22 | Discharge: 2015-06-22 | Disposition: A | Discharge status: Home / Self Care | Payer: Commercial Managed Care - HMO | Source: Ambulatory Visit | Attending: Gynecologic Oncology | Admitting: Gynecologic Oncology

## 2015-06-22 DIAGNOSIS — N83201 Unspecified ovarian cyst, right side: Secondary | ICD-10-CM | POA: Insufficient documentation

## 2015-06-22 DIAGNOSIS — R1900 Intra-abdominal and pelvic swelling, mass and lump, unspecified site: Secondary | ICD-10-CM | POA: Insufficient documentation

## 2015-06-29 ENCOUNTER — Other Ambulatory Visit: Payer: Self-pay | Admitting: *Deleted

## 2015-06-29 MED ORDER — LISINOPRIL 20 MG PO TABS: 20.0000 mg | ORAL_TABLET | Freq: Every day | ORAL | Status: DC

## 2015-06-29 NOTE — Telephone Encounter (Signed)
[facility] 

## 2015-07-03 ENCOUNTER — Telehealth: Payer: Self-pay

## 2015-07-03 ENCOUNTER — Ambulatory Visit: Payer: Self-pay | Admitting: Gynecology

## 2015-07-03 NOTE — Telephone Encounter (Signed)
Orders received from [facility] to contact the patient to update with results from US Transvaginal Non-OB results : Unremarkable study. Patient contacted , no answer , left a detailed message with call back requested to ensure message was received . Call back information provided , patient was scheduled for MD follow up today with Dr Marti Sleigh at 10:30 AM and was a no call , no show for appointment.

## 2015-07-22 ENCOUNTER — Ambulatory Visit
Admission: RE | Admit: 2015-07-22 | Discharge: 2015-07-22 | Disposition: A | Discharge status: Home / Self Care | Payer: Commercial Managed Care - HMO | Source: Ambulatory Visit | Attending: Internal Medicine | Admitting: Internal Medicine

## 2015-07-22 DIAGNOSIS — M545 Low back pain, unspecified: Secondary | ICD-10-CM

## 2015-07-22 DIAGNOSIS — M25461 Effusion, right knee: Secondary | ICD-10-CM

## 2015-07-22 DIAGNOSIS — M7989 Other specified soft tissue disorders: Secondary | ICD-10-CM | POA: Diagnosis not present

## 2015-07-22 DIAGNOSIS — M25561 Pain in right knee: Secondary | ICD-10-CM | POA: Diagnosis not present

## 2015-07-29 ENCOUNTER — Other Ambulatory Visit: Payer: Self-pay | Admitting: *Deleted

## 2015-07-29 DIAGNOSIS — E119 Type 2 diabetes mellitus without complications: Secondary | ICD-10-CM

## 2015-07-29 MED ORDER — SITAGLIPTIN PHOSPHATE 100 MG PO TABS: 100.0000 mg | ORAL_TABLET | Freq: Every day | ORAL | Status: DC

## 2015-07-29 NOTE — Telephone Encounter (Signed)
[facility] 

## 2015-08-04 ENCOUNTER — Other Ambulatory Visit: Payer: Self-pay | Admitting: *Deleted

## 2015-08-04 MED ORDER — QUETIAPINE FUMARATE 100 MG PO TABS: ORAL_TABLET | ORAL | Status: DC

## 2015-08-04 MED ORDER — METFORMIN HCL 500 MG PO TABS: 500.0000 mg | ORAL_TABLET | Freq: Every day | ORAL | Status: DC

## 2015-08-04 NOTE — Telephone Encounter (Signed)
[facility] 

## 2015-08-05 ENCOUNTER — Ambulatory Visit (INDEPENDENT_AMBULATORY_CARE_PROVIDER_SITE_OTHER): Payer: Commercial Managed Care - HMO | Admitting: Psychiatry

## 2015-08-05 VITALS — BP 116/78 | HR 72 | Ht 62.0 in | Wt 245.4 lb

## 2015-08-05 DIAGNOSIS — F325 Major depressive disorder, single episode, in full remission: Secondary | ICD-10-CM | POA: Diagnosis not present

## 2015-08-05 MED ORDER — DULOXETINE HCL 60 MG PO CPEP: 60.0000 mg | ORAL_CAPSULE | Freq: Every day | ORAL | Status: DC

## 2015-08-05 NOTE — Progress Notes (Signed)
Patient ID: Lauren Mccormick, female   DOB: [birth year], 50 y.o.   MRN: [id number] [facility] MD Progress Note  08/05/2015 3:00 PM Lauren Mccormick  MRN:  [id number] Subjective:  [facility] [place] Problem: Major depression, recurrent, remission Diagnosis:  Major depression, recurrent, remission This patient is doing very well. She's been to the [facility] which she lives for the last 6 months. The patient is sleeping and eating well. She's done well off Seroquel. She is off Lexapro. She is no evidence of psychosis. She enjoys the other people she lives with. She has good energy and stays active. The patient does not drink any alcohol or use any drugs. The patient takes her medicines as prescribed. The patient has no physical complaints. She still makes contact with her mother. The patient seems to be outgoing and energetic. She is friendly and very tolerant. She denies any neurological symptoms at this time. The patient denies any hallucinations or delusions Patient Active Problem List   Diagnosis Date Noted  . Diabetes mellitus, type 2 (HCC) [E11.9] 01/02/2015  . Essential hypertension, benign [I10] 01/02/2015  . Tachycardia [R00.0] 01/02/2015  . GAD (generalized anxiety disorder) [F41.1] 10/08/2013  . Social anxiety disorder [F40.10] 10/08/2013  . Major depression (HCC) [F32.9] 10/05/2013  . Suicidal ideations [R45.851] 10/05/2013  . MDD (major depressive disorder), recurrent episode, severe (HCC) [F33.2] 10/05/2013  . Dyspnea [R06.00] 08/12/2013  . Morbid obesity (HCC) [E66.01] 08/12/2013   Total Time spent with patient: 15 minutes  Past Psychiatric History:   Past Medical History:  Past Medical History  Diagnosis Date  . Anemia   . Depression   . Diabetes mellitus without complication (HCC)   . Hypertension   . Diabetes mellitus, type II (HCC)   . Tachycardia 2012    Periodic problems with tachycardia with undetermined cause    Past Surgical History  Procedure Laterality Date  . Bean  in ears      childhood   Family History:  Family History  Problem Relation Age of Onset  . Hypertension Mother   . Heart disease Father   . Hypertension Father   . Stroke Father   . Alcohol abuse Father   . Diabetes Sister   . Anxiety disorder Brother   . Depression Brother   . Drug abuse Cousin   . Colon polyps Neg Hx    Family Psychiatric  History:  Social History:  History  Alcohol Use No     History  Drug Use No    Social History   Social History  . Marital Status: Single    Spouse Name: N/A  . Number of Children: N/A  . Years of Education: N/A   Social History Main Topics  . Smoking status: Never Smoker   . Smokeless tobacco: Never Used  . Alcohol Use: No  . Drug Use: No  . Sexual Activity: Not Currently   Other Topics Concern  . Not on file   Social History Narrative   Diet:    Do you drink/eat things with caffeine? Yes   Marital status: No                              What year were you married?    Do you live in a house, apartment, assisted living, condo, trailer, etc)? Apartment   Is it one or more stories?    How many persons live in your home?    Do you  have any pets in your home? Dog   Current or past profession:    Do you exercise?    No                                                Type & how often:   Do you have a living will? No   Do you have a DNR Form?  No                                                                                                                   Do you have a POA/HPOA forms? No   Additional Social History:                         Sleep: Good  Appetite:  Good  Current Medications: Current Outpatient Prescriptions  Medication Sig Dispense Refill  . aspirin 81 MG chewable tablet Chew 1 tablet (81 mg total) by mouth daily. For heart health    . atenolol (TENORMIN) 50 MG tablet Take 1 tablet (50 mg total) by mouth daily. 30 tablet 3  . Coenzyme Q10 (CO Q 10) 100 MG CAPS Take 100 mg by mouth daily.    .  DULoxetine (CYMBALTA) 60 MG capsule Take 1 capsule (60 mg total) by mouth daily. 30 capsule 7  . famotidine (PEPCID) 20 MG tablet Take 1 tablet (20 mg total) by mouth 2 (two) times daily. For acid reflux    . lisinopril (PRINIVIL,ZESTRIL) 20 MG tablet Take 1 tablet (20 mg total) by mouth daily. For high blood pressure 30 tablet 3  . metFORMIN (GLUCOPHAGE) 500 MG tablet Take 1 tablet (500 mg total) by mouth daily with breakfast. For diabetes 30 tablet 3  . Multiple Vitamin (MULTIVITAMIN WITH MINERALS) TABS tablet Take 1 tablet by mouth daily. For low vitamin    . sitaGLIPtin (JANUVIA) 100 MG tablet Take 1 tablet (100 mg total) by mouth daily. For diabetes 90 tablet 1  . Tdap (ADACEL) 09-07-13.5 LF-MCG/0.5 injection Inject 0.5 mLs into the muscle once. (Patient not taking: Reported on 05/29/2015) 0.5 mL 0   No current facility-administered medications for this visit.    Lab Results: No results found for this or any previous visit (from the past 48 hour(s)).  Physical Findings: AIMS:  , ,  ,  ,    CIWA:    COWS:     Musculoskeletal: Strength & Muscle Tone: within normal limits Gait & Station: normal Patient leans: Right  Psychiatric Specialty Exam: ROS  Blood pressure 116/78, pulse 72, height 5\' 2"  (1.575 m), weight 245 lb 6.4 oz (111.313 kg).Body mass index is 44.87 kg/(m^2).  General Appearance: Casual  Eye Contact::  Good  Speech:  Clear and Coherent  Volume:  Normal  Mood:  Euthymic  Affect:  Negative  Thought Process:  Coherent  Orientation:  Full (Time, Place, and Person)  Thought Content:  WDL  Suicidal Thoughts:  No  Homicidal Thoughts:  No  Memory:  NA  Judgement:  Good  Insight:  Good  Psychomotor Activity:  Normal  Concentration:  NA  Recall:  Good  Fund of Knowledge:Good  Language: Good  Akathisia:  No  Handed:  Right  AIMS (if indicated):     Assets:  Communication Skills  ADL's:  Intact  Cognition: WNL  Sleep:      Treatment Plan Summary: At this time the  patient is doing well. She takes her Cymbalta as ordered. She is active and energetic shows no signs of depression. She shows no signs of psychosis. This patient to return to see me in 5 months.  Khaleelah Yowell [place] 08/05/2015, 3:00 PM

## 2015-08-07 ENCOUNTER — Other Ambulatory Visit: Payer: Self-pay | Admitting: *Deleted

## 2015-08-07 MED ORDER — METFORMIN HCL 500 MG PO TABS: 500.0000 mg | ORAL_TABLET | Freq: Every day | ORAL | Status: DC

## 2015-08-07 NOTE — Telephone Encounter (Signed)
[facility] 

## 2015-09-23 ENCOUNTER — Other Ambulatory Visit: Payer: Self-pay

## 2015-09-23 MED ORDER — ATENOLOL 50 MG PO TABS: 50.0000 mg | ORAL_TABLET | Freq: Every day | ORAL | Status: DC

## 2015-09-28 ENCOUNTER — Other Ambulatory Visit: Payer: Commercial Managed Care - HMO

## 2015-09-28 DIAGNOSIS — I10 Essential (primary) hypertension: Secondary | ICD-10-CM | POA: Diagnosis not present

## 2015-09-28 DIAGNOSIS — E119 Type 2 diabetes mellitus without complications: Secondary | ICD-10-CM | POA: Diagnosis not present

## 2015-09-28 DIAGNOSIS — F331 Major depressive disorder, recurrent, moderate: Secondary | ICD-10-CM | POA: Diagnosis not present

## 2015-09-29 ENCOUNTER — Other Ambulatory Visit: Payer: Self-pay

## 2015-09-29 LAB — BASIC METABOLIC PANEL
BUN / CREAT RATIO: 10 (ref 9–23)
BUN: 6 mg/dL (ref 6–24)
CO2: 22 mmol/L (ref 18–29)
CREATININE: 0.6 mg/dL (ref 0.57–1.00)
Calcium: 9 mg/dL (ref 8.7–10.2)
Chloride: 99 mmol/L (ref 96–106)
GFR calc non Af Amer: 106 mL/min/{1.73_m2} (ref >59)
GFR, EST AFRICAN AMERICAN: 122 mL/min/{1.73_m2} (ref >59)
GLUCOSE: 123 mg/dL — AB (ref 65–99)
Potassium: 4.1 mmol/L (ref 3.5–5.2)
Sodium: 138 mmol/L (ref 134–144)

## 2015-09-29 LAB — ALT: ALT: 13 IU/L (ref 0–32)

## 2015-09-29 LAB — LIPID PANEL
Chol/HDL Ratio: 3 ratio units (ref 0.0–4.4)
Cholesterol, Total: 154 mg/dL (ref 100–199)
HDL: 51 mg/dL (ref >39)
LDL Calculated: 85 mg/dL (ref 0–99)
Triglycerides: 88 mg/dL (ref 0–149)
VLDL CHOLESTEROL CAL: 18 mg/dL (ref 5–40)

## 2015-09-29 LAB — HEMOGLOBIN A1C
Est. average glucose Bld gHb Est-mCnc: 169 mg/dL
HEMOGLOBIN A1C: 7.5 % — AB (ref 4.8–5.6)

## 2015-09-29 MED ORDER — METFORMIN HCL 500 MG PO TABS: 500.0000 mg | ORAL_TABLET | Freq: Two times a day (BID) | ORAL | Status: DC

## 2015-09-29 NOTE — Telephone Encounter (Signed)
New prescription was printed to cover dosage changes that were ordered with last lab results. Rx was printed and mailed to patient due to non working telephone number on file for patient. Lab results were also printed and mailed to lab.

## 2015-09-30 ENCOUNTER — Ambulatory Visit (INDEPENDENT_AMBULATORY_CARE_PROVIDER_SITE_OTHER): Payer: Commercial Managed Care - HMO | Admitting: Internal Medicine

## 2015-09-30 ENCOUNTER — Encounter: Payer: Self-pay | Admitting: Internal Medicine

## 2015-09-30 VITALS — BP 138/96 | HR 83 | Temp 97.8°F | Ht 62.0 in | Wt 245.2 lb

## 2015-09-30 DIAGNOSIS — E119 Type 2 diabetes mellitus without complications: Secondary | ICD-10-CM

## 2015-09-30 DIAGNOSIS — N839 Noninflammatory disorder of ovary, fallopian tube and broad ligament, unspecified: Secondary | ICD-10-CM

## 2015-09-30 DIAGNOSIS — F411 Generalized anxiety disorder: Secondary | ICD-10-CM

## 2015-09-30 DIAGNOSIS — I10 Essential (primary) hypertension: Secondary | ICD-10-CM

## 2015-09-30 DIAGNOSIS — F331 Major depressive disorder, recurrent, moderate: Secondary | ICD-10-CM

## 2015-09-30 DIAGNOSIS — N838 Other noninflammatory disorders of ovary, fallopian tube and broad ligament: Secondary | ICD-10-CM

## 2015-09-30 MED ORDER — METFORMIN HCL 500 MG PO TABS
500.0000 mg | ORAL_TABLET | Freq: Two times a day (BID) | ORAL | Status: DC
Start: 2015-09-30 — End: 2016-02-01

## 2015-09-30 MED ORDER — LISINOPRIL-HYDROCHLOROTHIAZIDE 20-12.5 MG PO TABS: 1.0000 | ORAL_TABLET | Freq: Every day | ORAL | Status: DC

## 2015-09-30 NOTE — Patient Instructions (Addendum)
Recommend contact Dr Plovsky's office to setup appointment to discuss concerns about medication  STOP LISINOPRIL  START LISINOPRIL HCT 20/12.5 mg daily  Continue other medications as ordered  Follow up in 1 month for blood pressure management

## 2015-09-30 NOTE — Progress Notes (Signed)
Location:  [facility] Place of Service: OFFICE  Chief Complaint  Patient presents with  . Medical Management of Chronic Issues    4 month follow up  . OTHER    patient would like to discuss POA  . Shortness of Breath    Patient c/o SOB   . 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 Followed by GYN ON[facility] Dr Fermin Schwab. No signs of malignancy when last checked and was told it is likely benign.  DM - she does not check BS at home on a regular basis but has been fluctuating 110-200s. No low BS reactions. She takes metformin and [place]. Occasional numbness/tingling in feet/hands. Last eye exam in 07/2013. She does not have vision insurance yet. A1c 7.5%. LDL 85; HDL 51  Depression - she takes cymbalta. seroquel was stopped a few mos ago due to sedation. She notes increased sexual desire worsening since seroquel stopped. Mood is stable overall. She sees psych Dr Casimiro Needle with [facility]. She has intermittent paranoia. No hallucinations. She has MDD (recurrent, severe), GAD and social anxiety d/o.   HTN - BP stable on lisinopril and atenolol. She takes ASA 81mg  daily  Tachycardia - she has frequent palpitations and reports past hx afib. She takes atenolol  She c/o right leg weakness that prevents her walking long distances without stopping to rest. No recent falls. She has low back pain. Occasional numbness in legs/feet. No loss of bowel/bladder control   Past Medical History  Diagnosis Date  . Anemia   . Depression   . Diabetes mellitus without complication ([place])   . Hypertension   . Diabetes mellitus, type II ([place])   . Tachycardia 2012    Periodic problems with tachycardia with undetermined cause    Past Surgical History  Procedure Laterality Date  . Bean in ears      childhood    Patient Care Team: Gildardo Cranker,  DO as PCP - General (Internal Medicine)  Social History   Social History  . Marital Status: Single    Spouse Name: N/A  . Number of Children: N/A  . Years of Education: N/A   Occupational History  . Not on file.   Social History Main Topics  . Smoking status: Never Smoker   . Smokeless tobacco: Never Used  . Alcohol Use: No  . Drug Use: No  . Sexual Activity: Not Currently   Other Topics Concern  . Not on file   Social History Narrative   Diet:    Do you drink/eat things with caffeine? Yes   Marital status: No                              What year were you married?    Do you live in a house, apartment, assisted living, condo, trailer, etc)? Apartment   Is it one or more stories?    How many persons live in your home?    Do you have any pets in your home? Dog   Current or past profession:    Do you exercise?    No  Type & how often:   Do you have a living will? No   Do you have a DNR Form?  No                                                                                                                   Do you have a POA/HPOA forms? No     reports that she has never smoked. She has never used smokeless tobacco. She reports that she does not drink alcohol or use illicit drugs.  No Known Allergies  Medications: Patient's Medications  New Prescriptions   No medications on file  Previous Medications   ASPIRIN 81 MG CHEWABLE TABLET    Chew 1 tablet (81 mg total) by mouth daily. For heart health   ATENOLOL (TENORMIN) 50 MG TABLET    Take 1 tablet (50 mg total) by mouth daily.   DULOXETINE (CYMBALTA) 60 MG CAPSULE    Take 1 capsule (60 mg total) by mouth daily.   FAMOTIDINE (PEPCID) 20 MG TABLET    Take 1 tablet (20 mg total) by mouth 2 (two) times daily. For acid reflux   LISINOPRIL (PRINIVIL,ZESTRIL) 20 MG TABLET    Take 1 tablet (20 mg total) by mouth daily. For high blood pressure   METFORMIN (GLUCOPHAGE) 500 MG TABLET     Take 1 tablet (500 mg total) by mouth 2 (two) times daily with a meal.   MULTIPLE VITAMIN (MULTIVITAMIN WITH MINERALS) TABS TABLET    Take 1 tablet by mouth daily. For low vitamin   SITAGLIPTIN (JANUVIA) 100 MG TABLET    Take 1 tablet (100 mg total) by mouth daily. For diabetes  Modified Medications   No medications on file  Discontinued Medications   COENZYME Q10 (CO Q 10) 100 MG CAPS    Take 100 mg by mouth daily. Reported on 09/30/2015   TDAP (ADACEL) 09-07-13.5 LF-MCG/0.5 INJECTION    Inject 0.5 mLs into the muscle once.    Review of Systems  Constitutional: Negative for fever, chills, diaphoresis, activity change, appetite change and fatigue.  HENT: Negative for ear pain and sore throat.   Eyes: Negative for visual disturbance.  Respiratory: Positive for shortness of breath (with palpitations). Negative for cough and chest tightness.   Cardiovascular: Positive for palpitations. Negative for chest pain and leg swelling.  Gastrointestinal: Negative for nausea, vomiting, abdominal pain, diarrhea, constipation and blood in stool.  Genitourinary: Negative for dysuria.  Musculoskeletal: Negative for arthralgias.  Neurological: Positive for weakness and numbness. Negative for dizziness, tremors and headaches.  Psychiatric/Behavioral: Positive for dysphoric mood. Negative for sleep disturbance. The patient is nervous/anxious.     Filed Vitals:   09/30/15 1408  BP: 142/102 repeat  138/96  Pulse: 83  Temp: 97.8 F (36.6 C)  TempSrc: Oral  Height: 5\' 2"  (1.575 m)  Weight: 245 lb 3.2 oz (111.222 kg)  SpO2: 98%   Body mass index is 44.84 kg/(m^2).  Physical Exam  Constitutional: She appears well-developed and well-nourished.  HENT:  Mouth/Throat:  Oropharynx is clear and moist. No oropharyngeal exudate.  Eyes: Pupils are equal, round, and reactive to light. No scleral icterus.  Neck: Neck supple. Carotid bruit is not present. No tracheal deviation present. No thyromegaly present.    Cardiovascular: Normal rate, regular rhythm, normal heart sounds and intact distal pulses.  Exam reveals no gallop and no friction rub.   No murmur heard. +1 pitting LE edema b/l. No calf TTP  Pulmonary/Chest: Effort normal and breath sounds normal. No stridor. No respiratory distress. She has no wheezes. She has no rales.  Abdominal: Soft. Bowel sounds are normal. She exhibits no distension and no mass. There is no hepatomegaly. There is no tenderness. There is no rebound and no guarding.  Musculoskeletal: She exhibits edema and tenderness.  R>L knee crepitus and swelling;  Neg SLR; no short leg; paravertebral lumbar muscle hypertrophy with ropy tissue texture changes; increased lumbar lordosis  Lymphadenopathy:    She has no cervical adenopathy.  Neurological: She is alert.  Skin: Skin is warm and dry. No rash noted.     Psychiatric: She has a normal mood and affect. Her behavior is normal. Thought content normal.     Labs reviewed: Appointment on 09/28/2015  Component Date Value Ref Range Status  . Glucose 09/28/2015 123* 65 - 99 mg/dL Final  . BUN 09/28/2015 6  6 - 24 mg/dL Final  . Creatinine, Ser 09/28/2015 0.60  0.57 - 1.00 mg/dL Final  . GFR calc non Af Amer 09/28/2015 106  >59 mL/min/1.73 Final  . GFR calc Af Amer 09/28/2015 122  >59 mL/min/1.73 Final  . BUN/Creatinine Ratio 09/28/2015 10  9 - 23 Final  . Sodium 09/28/2015 138  134 - 144 mmol/L Final  . Potassium 09/28/2015 4.1  3.5 - 5.2 mmol/L Final  . Chloride 09/28/2015 99  96 - 106 mmol/L Final  . CO2 09/28/2015 22  18 - 29 mmol/L Final  . Calcium 09/28/2015 9.0  8.7 - 10.2 mg/dL Final  . ALT 09/28/2015 13  0 - 32 IU/L Final  . Cholesterol, Total 09/28/2015 154  100 - 199 mg/dL Final  . Triglycerides 09/28/2015 88  0 - 149 mg/dL Final  . HDL 09/28/2015 51  >39 mg/dL Final  . VLDL Cholesterol Cal 09/28/2015 18  5 - 40 mg/dL Final  . LDL Calculated 09/28/2015 85  0 - 99 mg/dL Final  . Chol/HDL Ratio 09/28/2015 3.0   0.0 - 4.4 ratio units Final   Comment:                                   T. Chol/HDL Ratio                                             Men  Women                               1/2 Avg.Risk  3.4    3.3                                   Avg.Risk  5.0    4.4  2X Avg.Risk  9.6    7.1                                3X Avg.Risk 23.4   11.0   . Hgb A1c MFr Bld 09/28/2015 7.5* 4.8 - 5.6 % Final   Comment:          Pre-diabetes: 5.7 - 6.4          Diabetes: >6.4          Glycemic control for adults with diabetes: <7.0   . Est. average glucose Bld gHb Est-m* 09/28/2015 169   Final    No results found.   Assessment/Plan   ICD-9-CM ICD-10-CM   1. Type 2 diabetes mellitus without complication, without long-term current use of insulin (HCC) - uncontrolled 250.00 E11.9 metFORMIN (GLUCOPHAGE) 500 MG tablet  2. Essential hypertension, benign - uncontrolled 401.1 I10 lisinopril-hydrochlorothiazide (ZESTORETIC) 20-12.5 MG tablet  3. Major depressive disorder, recurrent episode, moderate (HCC) 296.32 F33.1   4. GAD (generalized anxiety disorder) 300.02 F41.1   5. Ovarian mass, right 620.9 N83.9    Recommend contact Dr Plovsky's office to setup appointment to discuss concerns about medication  STOP LISINOPRIL  START LISINOPRIL HCT 20/12.5 mg daily  F/u with specialists as scheduled  Continue other medications as ordered  Follow up in 1 month for blood pressure management   Lauren Mccormick S. Perlie Gold  [facility] [address] [place], [place] [address] [phone] Cell (Monday-Friday 8 AM - 5 PM) [phone] After 5 PM and follow prompts   .

## 2015-10-27 ENCOUNTER — Other Ambulatory Visit: Payer: Self-pay | Admitting: *Deleted

## 2015-10-27 DIAGNOSIS — E119 Type 2 diabetes mellitus without complications: Secondary | ICD-10-CM

## 2015-10-27 MED ORDER — SITAGLIPTIN PHOSPHATE 100 MG PO TABS: 100.0000 mg | ORAL_TABLET | Freq: Every day | ORAL | Status: DC

## 2015-10-27 NOTE — Telephone Encounter (Signed)
[facility] 

## 2015-11-19 ENCOUNTER — Telehealth: Payer: Self-pay

## 2015-11-19 NOTE — Telephone Encounter (Signed)
I do not understand request??

## 2015-11-19 NOTE — Telephone Encounter (Signed)
Note received from patient's pharmacy stating patient needs another drug added to her treatment regimen. Simvastatin 20 mg or atorvastatin 10 mg. Please advise   Last OV 09/30/15, Pending OV tomorrow 11/20/15

## 2015-11-19 NOTE — Telephone Encounter (Signed)
Patient has pending appointment tomorrow. Request/fax placed in blue folder for review at appointment.

## 2015-11-20 ENCOUNTER — Encounter: Payer: Self-pay | Admitting: Internal Medicine

## 2015-11-20 ENCOUNTER — Ambulatory Visit (INDEPENDENT_AMBULATORY_CARE_PROVIDER_SITE_OTHER): Payer: Commercial Managed Care - HMO | Admitting: Internal Medicine

## 2015-11-20 VITALS — BP 118/80 | HR 74 | Temp 98.0°F | Ht 62.0 in | Wt 243.2 lb

## 2015-11-20 DIAGNOSIS — E119 Type 2 diabetes mellitus without complications: Secondary | ICD-10-CM | POA: Diagnosis not present

## 2015-11-20 DIAGNOSIS — R059 Cough, unspecified: Secondary | ICD-10-CM

## 2015-11-20 DIAGNOSIS — I10 Essential (primary) hypertension: Secondary | ICD-10-CM

## 2015-11-20 DIAGNOSIS — Z579 Occupational exposure to unspecified risk factor: Secondary | ICD-10-CM

## 2015-11-20 DIAGNOSIS — Z565 Uncongenial work environment: Secondary | ICD-10-CM | POA: Diagnosis not present

## 2015-11-20 DIAGNOSIS — R0600 Dyspnea, unspecified: Secondary | ICD-10-CM

## 2015-11-20 DIAGNOSIS — R05 Cough: Secondary | ICD-10-CM

## 2015-11-20 MED ORDER — TETANUS-DIPHTH-ACELL PERTUSSIS 5-2.5-18.5 LF-MCG/0.5 IM SUSP: 0.5000 mL | Freq: Once | INTRAMUSCULAR | Status: DC

## 2015-11-20 NOTE — Patient Instructions (Signed)
Will call with CT (CAT scan) appt and results  Continue current medications as ordered  Follow up in 3 mos for routine visit. Fasting labs prior to appt  Follow up with behavioral health as scheduled

## 2015-11-20 NOTE — Progress Notes (Signed)
Patient ID: Lauren Mccormick, female   DOB: [birth year], 50 y.o.   MRN: [id number]    Location:  [facility] Place of Service: OFFICE  Chief Complaint  Patient presents with  . Medical Management of Chronic Issues    1 month follow up   . Medication Management    Discuss recommendation from pharmacy to start statin  . Health maintenance    Unable to make eye doctor appointment due to insurance     HPI:  50 yo female seen today for f/u HTN. She has intermittent SOB with associated weakness x 6-4yrs. She has never had a formal w/u. Nonsmoker. occasional dizziness with SOB. No HA. BP improved today on lisinopril hct. 2D echo in 2015 revealed LA mildly dilated; EF 55-60%. She saw cardio Dr Elease Hashimoto in 2015 but prefers to see a different provider if she needs to be referred again.CXR in 2015 was neg for acute process. 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 She did wear a mask most times. No hemoptysis, night sweats, weight loss  DM - she does not check BS at home on a regular basis but has been fluctuating 110-200s. No low BS reactions. She takes metformin and [place]. Occasional numbness/tingling in feet/hands. Last eye exam in 07/2013. She does not have vision insurance yet. A1c 7.5%. LDL 85; HDL 51  Depression - she takes cymbalta. seroquel was stopped a few mos ago due to sedation. She notes increased sexual desire worsening since seroquel stopped. Mood is stable overall. She sees psych Dr Donell Beers with [facility]. She has intermittent paranoia. No hallucinations. She has MDD (recurrent, severe), GAD and social anxiety d/o.   HTN - BP stable on lisinopril hct and atenolol. She takes ASA 81mg  daily  Tachycardia - she has frequent palpitations and reports past hx afib. She takes atenolol  She c/o right leg weakness that prevents her walking long distances without stopping to rest. No recent falls.  She has low back pain. Occasional numbness in legs/feet. No loss of bowel/bladder control   Past Medical History  Diagnosis Date  . Anemia   . Depression   . Diabetes mellitus without complication (HCC)   . Hypertension   . Diabetes mellitus, type II (HCC)   . Tachycardia 2012    Periodic problems with tachycardia with undetermined cause    Past Surgical History  Procedure Laterality Date  . Bean in ears      childhood    Patient Care Team: Kirt Boys, DO as PCP - General (Internal Medicine)  Social History   Social History  . Marital Status: Single    Spouse Name: N/A  . Number of Children: N/A  . Years of Education: N/A   Occupational History  . Not on file.   Social History Main Topics  . Smoking status: Never Smoker   . Smokeless tobacco: Never Used  . Alcohol Use: No  . Drug Use: No  . Sexual Activity: Not Currently   Other Topics Concern  . Not on file   Social History Narrative   Diet:    Do you drink/eat things with caffeine? Yes   Marital status: No                              What year were you married?    Do you  live in a house, apartment, assisted living, condo, trailer, etc)? Apartment   Is it one or more stories?    How many persons live in your home?    Do you have any pets in your home? Dog   Current or past profession:    Do you exercise?    No                                                Type & how often:   Do you have a living will? No   Do you have a DNR Form?  No                                                                                                                   Do you have a POA/HPOA forms? No     reports that she has never smoked. She has never used smokeless tobacco. She reports that she does not drink alcohol or use illicit drugs.  Family History  Problem Relation Age of Onset  . Hypertension Mother   . Heart disease Father   . Hypertension Father   . Stroke Father   . Alcohol abuse Father   . Diabetes  Sister   . Anxiety disorder Brother   . Depression Brother   . Drug abuse Cousin   . Colon polyps Neg Hx    Family Status  Relation Status Death Age  . Mother Deceased   . Father Deceased   . Sister Alive   . Brother Alive      No Known Allergies  Medications: Patient's Medications  New Prescriptions   No medications on file  Previous Medications   ASPIRIN 81 MG CHEWABLE TABLET    Chew 1 tablet (81 mg total) by mouth daily. For heart health   ATENOLOL (TENORMIN) 50 MG TABLET    Take 1 tablet (50 mg total) by mouth daily.   DULOXETINE (CYMBALTA) 60 MG CAPSULE    Take 1 capsule (60 mg total) by mouth daily.   FAMOTIDINE (PEPCID) 20 MG TABLET    Take 1 tablet (20 mg total) by mouth 2 (two) times daily. For acid reflux   LISINOPRIL (PRINIVIL,ZESTRIL) 20 MG TABLET    Take 1 tablet (20 mg total) by mouth daily. For high blood pressure   LISINOPRIL-HYDROCHLOROTHIAZIDE (ZESTORETIC) 20-12.5 MG TABLET    Take 1 tablet by mouth daily.   METFORMIN (GLUCOPHAGE) 500 MG TABLET    Take 1 tablet (500 mg total) by mouth 2 (two) times daily with a meal.   MULTIPLE VITAMIN (MULTIVITAMIN WITH MINERALS) TABS TABLET    Take 1 tablet by mouth daily. For low vitamin   SITAGLIPTIN (JANUVIA) 100 MG TABLET    Take 1 tablet (100 mg total) by mouth daily. For diabetes  Modified Medications   Modified Medication Previous Medication   TDAP (BOOSTRIX) 5-2.5-18.5 LF-MCG/0.5  INJECTION Tdap (BOOSTRIX) 5-2.5-18.5 LF-MCG/0.5 injection      Inject 0.5 mLs into the muscle once.    Inject 0.5 mLs into the muscle once.  Discontinued Medications   No medications on file    Review of Systems  Unable to perform ROS: Psychiatric disorder    Filed Vitals:   11/20/15 1405  BP: 118/80  Pulse: 74  Temp: 98 F (36.7 C)  TempSrc: Oral  Height: 5\' 2"  (1.575 m)  Weight: 243 lb 3.2 oz (110.315 kg)  SpO2: 98%   Body mass index is 44.47 kg/(m^2).  Physical Exam  Constitutional: She appears well-developed and  well-nourished.  HENT:  Mouth/Throat: Oropharynx is clear and moist. No oropharyngeal exudate.  Eyes: Pupils are equal, round, and reactive to light. No scleral icterus.  Neck: Neck supple. Carotid bruit is not present. No tracheal deviation present. No thyromegaly present.  Cardiovascular: Normal rate, regular rhythm, normal heart sounds and intact distal pulses.  Exam reveals no gallop and no friction rub.   No murmur heard. +1 pitting LE edema b/l. No calf TTP  Pulmonary/Chest: Effort normal and breath sounds normal. No stridor. No respiratory distress. She has no wheezes. She has no rales.  Abdominal: Soft. Bowel sounds are normal. She exhibits no distension and no mass. There is no hepatomegaly. There is no tenderness. There is no rebound and no guarding.  Musculoskeletal: She exhibits edema and tenderness.  R>L knee crepitus and swelling;  Neg SLR; no short leg; paravertebral lumbar muscle hypertrophy with ropy tissue texture changes; increased lumbar lordosis  Lymphadenopathy:    She has no cervical adenopathy.  Neurological: She is alert.  Skin: Skin is warm and dry. No rash noted.     Psychiatric: She has a normal mood and affect. Her behavior is normal. Thought content normal.     Labs reviewed: Appointment on 09/28/2015  Component Date Value Ref Range Status  . Glucose 09/28/2015 123* 65 - 99 mg/dL Final  . BUN 36/64/4034 6  6 - 24 mg/dL Final  . Creatinine, Ser 09/28/2015 0.60  0.57 - 1.00 mg/dL Final  . GFR calc non Af Amer 09/28/2015 106  >59 mL/min/1.73 Final  . GFR calc Af Amer 09/28/2015 122  >59 mL/min/1.73 Final  . BUN/Creatinine Ratio 09/28/2015 10  9 - 23 Final  . Sodium 09/28/2015 138  134 - 144 mmol/L Final  . Potassium 09/28/2015 4.1  3.5 - 5.2 mmol/L Final  . Chloride 09/28/2015 99  96 - 106 mmol/L Final  . CO2 09/28/2015 22  18 - 29 mmol/L Final  . Calcium 09/28/2015 9.0  8.7 - 10.2 mg/dL Final  . ALT 74/25/9563 13  0 - 32 IU/L Final  . Cholesterol,  Total 09/28/2015 154  100 - 199 mg/dL Final  . Triglycerides 09/28/2015 88  0 - 149 mg/dL Final  . HDL 87/56/4332 51  >39 mg/dL Final  . VLDL Cholesterol Cal 09/28/2015 18  5 - 40 mg/dL Final  . LDL Calculated 09/28/2015 85  0 - 99 mg/dL Final  . Chol/HDL Ratio 09/28/2015 3.0  0.0 - 4.4 ratio units Final   Comment:                                   T. Chol/HDL Ratio  Men  Women                               1/2 Avg.Risk  3.4    3.3                                   Avg.Risk  5.0    4.4                                2X Avg.Risk  9.6    7.1                                3X Avg.Risk 23.4   11.0   . Hgb A1c MFr Bld 09/28/2015 7.5* 4.8 - 5.6 % Final   Comment:          Pre-diabetes: 5.7 - 6.4          Diabetes: >6.4          Glycemic control for adults with diabetes: <7.0   . Est. average glucose Bld gHb Est-m* 09/28/2015 169   Final    No results found.   Assessment/Plan   ICD-9-CM ICD-10-CM   1. Dyspnea 786.09 R06.00 CT Chest Wo Contrast   likely related to occupational exposure while employed at [employer]  2. Essential hypertension, benign 401.1 I10   3. Occupational exposure in workplace V62.1 Z56.5 CT Chest Wo Contrast  4. Cough 786.2 R05 CT Chest Wo Contrast  5. Type 2 diabetes mellitus without complication, without long-term current use of insulin (HCC) 250.00 E11.9     Continue current medications as ordered  Follow up in 3 mos for routine visit. Fasting labs prior to appt  Follow up with behavioral health as scheduled  Her LDL is 85 with HDL 51- she does not need statin at this time  Will likely need pulmonary eval  Latham Kinzler S. Ancil Linsey  [facility] [address] [place], [place] [address] [phone] Cell (Monday-Friday 8 AM - 5 PM) [phone] After 5 PM and follow prompts

## 2015-11-22 DIAGNOSIS — R059 Cough, unspecified: Secondary | ICD-10-CM | POA: Insufficient documentation

## 2015-11-22 DIAGNOSIS — R05 Cough: Secondary | ICD-10-CM | POA: Insufficient documentation

## 2015-12-08 ENCOUNTER — Encounter: Payer: Self-pay | Admitting: Internal Medicine

## 2016-01-06 ENCOUNTER — Ambulatory Visit (HOSPITAL_COMMUNITY): Payer: Self-pay | Admitting: Psychiatry

## 2016-01-15 ENCOUNTER — Inpatient Hospital Stay: Admission: RE | Admit: 2016-01-15 | Payer: Self-pay | Source: Ambulatory Visit

## 2016-01-20 ENCOUNTER — Other Ambulatory Visit: Payer: Self-pay

## 2016-01-22 ENCOUNTER — Ambulatory Visit
Admission: RE | Admit: 2016-01-22 | Discharge: 2016-01-22 | Disposition: A | Discharge status: Home / Self Care | Payer: Commercial Managed Care - HMO | Source: Ambulatory Visit | Attending: Internal Medicine | Admitting: Internal Medicine

## 2016-01-22 DIAGNOSIS — R911 Solitary pulmonary nodule: Secondary | ICD-10-CM | POA: Diagnosis not present

## 2016-01-22 DIAGNOSIS — Z579 Occupational exposure to unspecified risk factor: Secondary | ICD-10-CM

## 2016-01-22 DIAGNOSIS — R05 Cough: Secondary | ICD-10-CM

## 2016-01-22 DIAGNOSIS — R059 Cough, unspecified: Secondary | ICD-10-CM

## 2016-01-22 DIAGNOSIS — R0600 Dyspnea, unspecified: Secondary | ICD-10-CM

## 2016-02-01 ENCOUNTER — Other Ambulatory Visit: Payer: Self-pay | Admitting: *Deleted

## 2016-02-01 DIAGNOSIS — E119 Type 2 diabetes mellitus without complications: Secondary | ICD-10-CM

## 2016-02-01 MED ORDER — METFORMIN HCL 500 MG PO TABS: 500.0000 mg | ORAL_TABLET | Freq: Two times a day (BID) | ORAL | 4 refills | Status: DC

## 2016-02-01 NOTE — Telephone Encounter (Signed)
[facility] 

## 2016-02-19 ENCOUNTER — Ambulatory Visit (HOSPITAL_COMMUNITY): Payer: Self-pay | Admitting: Psychiatry

## 2016-02-22 ENCOUNTER — Other Ambulatory Visit: Payer: Commercial Managed Care - HMO

## 2016-02-24 ENCOUNTER — Ambulatory Visit: Payer: Commercial Managed Care - HMO | Admitting: Internal Medicine

## 2016-03-08 ENCOUNTER — Other Ambulatory Visit: Payer: Self-pay

## 2016-03-08 MED ORDER — ATENOLOL 50 MG PO TABS: 50.0000 mg | ORAL_TABLET | Freq: Every day | ORAL | 1 refills | Status: DC

## 2016-03-08 NOTE — Telephone Encounter (Signed)
Refill request received from [facility] for atenolol 50 mg tablets.   Prescription was sent to pharmacy electronically for: atenolol 50 mg tablet, take 1 tablet by mouth daily. #120 with 1 RF.

## 2016-03-21 ENCOUNTER — Other Ambulatory Visit: Payer: Self-pay

## 2016-03-21 DIAGNOSIS — I10 Essential (primary) hypertension: Secondary | ICD-10-CM

## 2016-03-21 DIAGNOSIS — E119 Type 2 diabetes mellitus without complications: Secondary | ICD-10-CM

## 2016-03-23 ENCOUNTER — Other Ambulatory Visit: Payer: Commercial Managed Care - HMO

## 2016-03-23 DIAGNOSIS — I10 Essential (primary) hypertension: Secondary | ICD-10-CM | POA: Diagnosis not present

## 2016-03-23 DIAGNOSIS — E119 Type 2 diabetes mellitus without complications: Secondary | ICD-10-CM | POA: Diagnosis not present

## 2016-03-23 LAB — CBC
HCT: 35.4 % (ref 35.0–45.0)
Hemoglobin: 11.8 g/dL (ref 11.7–15.5)
MCH: 28.5 pg (ref 27.0–33.0)
MCHC: 33.3 g/dL (ref 32.0–36.0)
MCV: 85.5 fL (ref 80.0–100.0)
MPV: 8.7 fL (ref 7.5–12.5)
PLATELETS: 368 10*3/uL (ref 140–400)
RBC: 4.14 MIL/uL (ref 3.80–5.10)
RDW: 13.9 % (ref 11.0–15.0)
WBC: 8.6 10*3/uL (ref 3.8–10.8)

## 2016-03-23 LAB — LIPID PANEL
CHOL/HDL RATIO: 2.8 ratio (ref <5.0)
Cholesterol: 154 mg/dL (ref <200)
HDL: 55 mg/dL (ref >50)
LDL CALC: 80 mg/dL (ref <100)
Triglycerides: 95 mg/dL (ref <150)
VLDL: 19 mg/dL (ref <30)

## 2016-03-23 LAB — COMPLETE METABOLIC PANEL WITH GFR
ALK PHOS: 58 U/L (ref 33–130)
ALT: 8 U/L (ref 6–29)
AST: 11 U/L (ref 10–35)
Albumin: 3.8 g/dL (ref 3.6–5.1)
BILIRUBIN TOTAL: 0.2 mg/dL (ref 0.2–1.2)
BUN: 9 mg/dL (ref 7–25)
CO2: 25 mmol/L (ref 20–31)
CREATININE: 0.7 mg/dL (ref 0.50–1.05)
Calcium: 9.4 mg/dL (ref 8.6–10.4)
Chloride: 103 mmol/L (ref 98–110)
GFR, Est Non African American: >89 mL/min (ref >=60)
Glucose, Bld: 130 mg/dL — ABNORMAL HIGH (ref 65–99)
Potassium: 3.8 mmol/L (ref 3.5–5.3)
SODIUM: 137 mmol/L (ref 135–146)
Total Protein: 6.7 g/dL (ref 6.1–8.1)

## 2016-03-23 LAB — TSH: TSH: 2.4 mIU/L

## 2016-03-24 LAB — MICROALBUMIN / CREATININE URINE RATIO
Creatinine, Urine: 225 mg/dL (ref 20–320)
MICROALB/CREAT RATIO: 4 ug/mg{creat} (ref <30)
Microalb, Ur: 0.8 mg/dL

## 2016-03-24 LAB — HEMOGLOBIN A1C
HEMOGLOBIN A1C: 6.4 % — AB (ref <5.7)
MEAN PLASMA GLUCOSE: 137 mg/dL

## 2016-03-25 ENCOUNTER — Encounter: Payer: Self-pay | Admitting: Internal Medicine

## 2016-03-25 ENCOUNTER — Ambulatory Visit (INDEPENDENT_AMBULATORY_CARE_PROVIDER_SITE_OTHER): Payer: Commercial Managed Care - HMO | Admitting: Internal Medicine

## 2016-03-25 VITALS — BP 118/80 | HR 81 | Temp 97.7°F | Ht 62.0 in | Wt 229.6 lb

## 2016-03-25 DIAGNOSIS — F331 Major depressive disorder, recurrent, moderate: Secondary | ICD-10-CM

## 2016-03-25 DIAGNOSIS — Z7189 Other specified counseling: Secondary | ICD-10-CM | POA: Diagnosis not present

## 2016-03-25 DIAGNOSIS — Z23 Encounter for immunization: Secondary | ICD-10-CM | POA: Diagnosis not present

## 2016-03-25 DIAGNOSIS — R059 Cough, unspecified: Secondary | ICD-10-CM

## 2016-03-25 DIAGNOSIS — E119 Type 2 diabetes mellitus without complications: Secondary | ICD-10-CM | POA: Diagnosis not present

## 2016-03-25 DIAGNOSIS — G8929 Other chronic pain: Secondary | ICD-10-CM | POA: Diagnosis not present

## 2016-03-25 DIAGNOSIS — R05 Cough: Secondary | ICD-10-CM | POA: Diagnosis not present

## 2016-03-25 DIAGNOSIS — R0609 Other forms of dyspnea: Secondary | ICD-10-CM

## 2016-03-25 DIAGNOSIS — M545 Low back pain: Secondary | ICD-10-CM

## 2016-03-25 DIAGNOSIS — I10 Essential (primary) hypertension: Secondary | ICD-10-CM

## 2016-03-25 DIAGNOSIS — Z579 Occupational exposure to unspecified risk factor: Secondary | ICD-10-CM | POA: Diagnosis not present

## 2016-03-25 NOTE — Progress Notes (Signed)
Patient ID: Lauren Mccormick, female   DOB: 1965/07/01, 50 y.o.   MRN: [id number]    Location:  [facility] Place of Service: OFFICE  Chief Complaint  Patient presents with  . Medical Management of Chronic Issues    3 month routine visit  . Flu Vaccine    requested  . Advanced Directive    discuss advance directive    HPI:  50 yo female seen today for f/u. She has same concerns regarding dyspnea. She is a poor historian due to psych d/o. Hx obtained from chart  Dyspnea - She has intermittent SOB with associated weakness x 6-76yr. Recent CT chest without contrast revealed 784mground glass nodule but no other parenchymal changes and no other changes in chest. Nonsmoker. occasional dizziness with SOB. No HA. BP improved today on lisinopril hct. 2D echo in 2015 revealed LA mildly dilated; EF 55-60%. She saw cardio Dr NaAcie Fredricksonn 2015 but prefers to see a different provider if she needs to be referred again.CXR in 2015 was neg for acute process. She has worked in a [employer] 8 yrs and her SOB began after working there. She fed the cotton into the machine. She did wear a mask most times. No hemoptysis, night sweats, weight loss. She notes dry cough and occasional chest pain.  DM - she does not check BS at home on a regular basis but has been fluctuating 110-200s. No low BS reactions. She takes metformin and jaTongaOccasional numbness/tingling in feet/hands. Last eye exam in 07/2013. She does not have vision insurance yet. A1c 6.4%. LDL 80; HDL 55. She has lost 14 lbs intentionally since last OV  Depression - she takes cymbalta. seroquel was stopped a few mos ago due to sedation. She notes increased sexual desire worsening since seroquel stopped. Mood is stable overall. She sees psych Dr [facility] has intermittent paranoia. No hallucinations. She has MDD (recurrent, severe), GAD and social anxiety d/o.   HTN - BP stable on lisinopril hct and atenolol.  She takes ASA 8122maily  Tachycardia - she has frequent palpitations and reports past hx afib. She takes atenolol  She c/o right leg weakness that prevents her walking long distances without stopping to rest. No recent falls. She has low back pain. Occasional numbness in legs/feet. No loss of bowel/bladder control   Past Medical History:  Diagnosis Date  . Anemia   . Depression   . Diabetes mellitus without complication ([place] . Diabetes mellitus, type II ([place] . Hypertension   . Tachycardia 2012   Periodic problems with tachycardia with undetermined cause    Past Surgical History:  Procedure Laterality Date  . bean in ears     childhood    Patient Care Team: MonGildardo CrankerO as PCP - General (Internal Medicine)  Social History   Social History  . Marital status: Single    Spouse name: N/A  . Number of children: N/A  . Years of education: N/A   Occupational History  . Not on file.   Social History Main Topics  . Smoking status: Never Smoker  . Smokeless tobacco: Never Used  . Alcohol use No  . Drug use: No  . Sexual activity: Not Currently   Other Topics Concern  . Not on file   Social History Narrative   Diet:    Do you drink/eat things with caffeine? Yes   Marital status: No  What year were you married?    Do you live in a house, apartment, assisted living, condo, trailer, etc)? Apartment   Is it one or more stories?    How many persons live in your home?    Do you have any pets in your home? Dog   Current or past profession:    Do you exercise?    No                                                Type & how often:   Do you have a living will? No   Do you have a DNR Form?  No                                                                                                                   Do you have a POA/HPOA forms? No     reports that she has never smoked. She has never used smokeless tobacco. She reports that she does  not drink alcohol or use drugs.  Family History  Problem Relation Age of Onset  . Hypertension Mother   . Heart disease Father   . Hypertension Father   . Stroke Father   . Alcohol abuse Father   . Diabetes Sister   . Anxiety disorder Brother   . Depression Brother   . Drug abuse Cousin   . Colon polyps Neg Hx    Family Status  Relation Status  . Mother Deceased  . Father Deceased  . Sister Alive  . Brother Alive  . Cousin   . Neg Hx      No Known Allergies  Medications: Patient's Medications  New Prescriptions   No medications on file  Previous Medications   ASPIRIN 81 MG CHEWABLE TABLET    Chew 1 tablet (81 mg total) by mouth daily. For heart health   ATENOLOL (TENORMIN) 50 MG TABLET    Take 1 tablet (50 mg total) by mouth daily.   DULOXETINE (CYMBALTA) 60 MG CAPSULE    Take 1 capsule (60 mg total) by mouth daily.   FAMOTIDINE (PEPCID) 20 MG TABLET    Take 1 tablet (20 mg total) by mouth 2 (two) times daily. For acid reflux   LISINOPRIL (PRINIVIL,ZESTRIL) 20 MG TABLET    Take 1 tablet (20 mg total) by mouth daily. For high blood pressure   LISINOPRIL-HYDROCHLOROTHIAZIDE (ZESTORETIC) 20-12.5 MG TABLET    Take 1 tablet by mouth daily.   METFORMIN (GLUCOPHAGE) 500 MG TABLET    Take 1 tablet (500 mg total) by mouth 2 (two) times daily with a meal.   MULTIPLE VITAMIN (MULTIVITAMIN WITH MINERALS) TABS TABLET    Take 1 tablet by mouth daily. For low vitamin   SITAGLIPTIN (JANUVIA) 100 MG TABLET    Take 1 tablet (100 mg total) by mouth daily. For diabetes   TDAP (  BOOSTRIX) 5-2.5-18.5 LF-MCG/0.5 INJECTION    Inject 0.5 mLs into the muscle once.  Modified Medications   No medications on file  Discontinued Medications   No medications on file    Review of Systems  Unable to perform ROS: Psychiatric disorder    Vitals:   03/25/16 0823  BP: 118/80  Pulse: 81  Temp: 97.7 F (36.5 C)  TempSrc: Oral  SpO2: 98%  Weight: 229 lb 9.6 oz (104.1 kg)  Height: '5\' 2"'  (1.575 m)     Body mass index is 41.99 kg/m.  Physical Exam  Constitutional: She appears well-developed and well-nourished.  HENT:  Mouth/Throat: Oropharynx is clear and moist. No oropharyngeal exudate.  Eyes: Pupils are equal, round, and reactive to light. No scleral icterus.  Neck: Neck supple. Carotid bruit is not present. No tracheal deviation present. No thyromegaly present.  Cardiovascular: Normal rate, regular rhythm, normal heart sounds and intact distal pulses.  Exam reveals no gallop and no friction rub.   No murmur heard. +1 pitting LE edema b/l. No calf TTP  Pulmonary/Chest: Effort normal and breath sounds normal. No stridor. No respiratory distress. She has no wheezes. She has no rales.  Abdominal: Soft. Bowel sounds are normal. She exhibits no distension and no mass. There is no hepatomegaly. There is no tenderness. There is no rebound and no guarding.  Musculoskeletal: She exhibits edema and tenderness.  paravertebral lumbar muscle hypertrophy with ropy tissue texture changes; increased lumbar lordosis  Lymphadenopathy:    She has no cervical adenopathy.  Neurological: She is alert.  Skin: Skin is warm and dry. No rash noted.     Psychiatric: She has a normal mood and affect. Her behavior is normal. Thought content normal.     Labs reviewed: Appointment on 03/23/2016  Component Date Value Ref Range Status  . Hgb A1c MFr Bld 03/24/2016 6.4* <5.7 % Final   Comment:   For someone without known diabetes, a hemoglobin A1c value between 5.7% and 6.4% is consistent with prediabetes and should be confirmed with a follow-up test.   For someone with known diabetes, a value <7% indicates that their diabetes is well controlled. A1c targets should be individualized based on duration of diabetes, age, co-morbid conditions and other considerations.   This assay result is consistent with an increased risk of diabetes.   Currently, no consensus exists regarding use of hemoglobin A1c  for diagnosis of diabetes in children.     . Mean Plasma Glucose 03/24/2016 137  mg/dL Final  . TSH 03/23/2016 2.40  mIU/L Final   Comment:   Reference Range   > or = 20 Years  0.40-4.50   Pregnancy Range First trimester  0.26-2.66 Second trimester 0.55-2.73 Third trimester  0.43-2.91     . WBC 03/23/2016 8.6  3.8 - 10.8 K/uL Final  . RBC 03/23/2016 4.14  3.80 - 5.10 MIL/uL Final  . Hemoglobin 03/23/2016 11.8  11.7 - 15.5 g/dL Final  . HCT 03/23/2016 35.4  35.0 - 45.0 % Final  . MCV 03/23/2016 85.5  80.0 - 100.0 fL Final  . MCH 03/23/2016 28.5  27.0 - 33.0 pg Final  . MCHC 03/23/2016 33.3  32.0 - 36.0 g/dL Final  . RDW 03/23/2016 13.9  11.0 - 15.0 % Final  . Platelets 03/23/2016 368  140 - 400 K/uL Final  . MPV 03/23/2016 8.7  7.5 - 12.5 fL Final  . Creatinine, Urine 03/24/2016 225  20 - 320 mg/dL Final  . Microalb, Ur 03/24/2016 0.8  Not estab mg/dL Final  . Microalb Creat Ratio 03/24/2016 4  <30 mcg/mg creat Final   Comment: The ADA has defined abnormalities in albumin excretion as follows:           Category           Result                            (mcg/mg creatinine)                 Normal:    <30       Microalbuminuria:    30 - 299   Clinical albuminuria:    > or = 300   The ADA recommends that at least two of three specimens collected within a 3 - 6 month period be abnormal before considering a patient to be within a diagnostic category.     . Cholesterol 03/23/2016 154  <200 mg/dL Final   Comment: ** Please note change in reference range(s). **     . Triglycerides 03/23/2016 95  <150 mg/dL Final   Comment: ** Please note change in reference range(s). **     . HDL 03/23/2016 55  >50 mg/dL Final   Comment: ** Please note change in reference range(s). **     . Total CHOL/HDL Ratio 03/23/2016 2.8  <5.0 Ratio Final  . VLDL 03/23/2016 19  <30 mg/dL Final  . LDL Cholesterol 03/23/2016 80  <100 mg/dL Final   Comment: ** Please note change in reference  range(s). **     . Sodium 03/23/2016 137  135 - 146 mmol/L Final  . Potassium 03/23/2016 3.8  3.5 - 5.3 mmol/L Final  . Chloride 03/23/2016 103  98 - 110 mmol/L Final  . CO2 03/23/2016 25  20 - 31 mmol/L Final  . Glucose, Bld 03/23/2016 130* 65 - 99 mg/dL Final  . BUN 03/23/2016 9  7 - 25 mg/dL Final  . Creat 03/23/2016 0.70  0.50 - 1.05 mg/dL Final   Comment:   For patients > or = 51 years of age: The upper reference limit for Creatinine is approximately 13% higher for people identified as African-American.     . Total Bilirubin 03/23/2016 0.2  0.2 - 1.2 mg/dL Final  . Alkaline Phosphatase 03/23/2016 58  33 - 130 U/L Final  . AST 03/23/2016 11  10 - 35 U/L Final  . ALT 03/23/2016 8  6 - 29 U/L Final  . Total Protein 03/23/2016 6.7  6.1 - 8.1 g/dL Final  . Albumin 03/23/2016 3.8  3.6 - 5.1 g/dL Final  . Calcium 03/23/2016 9.4  8.6 - 10.4 mg/dL Final  . GFR, Est African American 03/23/2016 >89  >=60 mL/min Final  . GFR, Est Non African American 03/23/2016 >89  >=60 mL/min Final    No results found.   Assessment/Plan   ICD-9-CM ICD-10-CM   1. Dyspnea on exertion 786.09 R06.09 Ambulatory referral to Pulmonology  2. Cough 786.2 R05 Ambulatory referral to Pulmonology  3. Essential hypertension, benign 401.1 I10   4. Type 2 diabetes mellitus without complication, without long-term current use of insulin (HCC) 250.00 E11.9 BMP with eGFR     ALT     Hemoglobin A1c  5. Advanced directives, counseling/discussion V65.49 Z71.89   6. Major depressive disorder, recurrent episode, moderate (HCC) 296.32 F33.1   7. Occupational exposure in workplace V62.1 Z57.9 Ambulatory referral to Pulmonology  8. Chronic right-sided low back pain  without sciatica 724.2 M54.5    338.29 G89.29   9. Encounter for immunization Z23 Z23 Flu Vaccine QUAD 36+ mos IM     She plans to apply for SCAT bus due to increased DOE  Will call with referral  Lab results discussed at visit today  Advanced  directives discussed. She prefers FULL CODE at this time  Continue current medications as ordered  Follow up in 3 mos for routine visit. Fasting labs prior to appt  Fisher S. Perlie Gold  [facility] [address] [place], [place] [address] [phone] Cell (Monday-Friday 8 AM - 5 PM) [phone] After 5 PM and follow prompts

## 2016-03-25 NOTE — Patient Instructions (Signed)
Continue current medications as ordered  Will call with referral to lung specialist  Advanced directives discussed. Please complete and get notarized at your earliest convenience  Flu shot given today  Follow up in 3 mos for routine visit. Fasting labs prior to appt

## 2016-04-15 ENCOUNTER — Ambulatory Visit (HOSPITAL_COMMUNITY): Payer: Self-pay | Admitting: Psychiatry

## 2016-04-29 ENCOUNTER — Ambulatory Visit (INDEPENDENT_AMBULATORY_CARE_PROVIDER_SITE_OTHER): Payer: Commercial Managed Care - HMO | Admitting: Psychiatry

## 2016-04-29 ENCOUNTER — Encounter (HOSPITAL_COMMUNITY): Payer: Self-pay | Admitting: Psychiatry

## 2016-04-29 VITALS — BP 102/68 | HR 97 | Ht 63.0 in | Wt 220.8 lb

## 2016-04-29 DIAGNOSIS — F3342 Major depressive disorder, recurrent, in full remission: Secondary | ICD-10-CM | POA: Diagnosis not present

## 2016-04-29 DIAGNOSIS — Z823 Family history of stroke: Secondary | ICD-10-CM | POA: Diagnosis not present

## 2016-04-29 DIAGNOSIS — Z811 Family history of alcohol abuse and dependence: Secondary | ICD-10-CM

## 2016-04-29 DIAGNOSIS — Z8249 Family history of ischemic heart disease and other diseases of the circulatory system: Secondary | ICD-10-CM

## 2016-04-29 DIAGNOSIS — Z79899 Other long term (current) drug therapy: Secondary | ICD-10-CM

## 2016-04-29 DIAGNOSIS — Z813 Family history of other psychoactive substance abuse and dependence: Secondary | ICD-10-CM

## 2016-04-29 DIAGNOSIS — Z833 Family history of diabetes mellitus: Secondary | ICD-10-CM

## 2016-04-29 DIAGNOSIS — Z7982 Long term (current) use of aspirin: Secondary | ICD-10-CM

## 2016-04-29 MED ORDER — DULOXETINE HCL 60 MG PO CPEP: 60.0000 mg | ORAL_CAPSULE | Freq: Every day | ORAL | 8 refills | Status: DC

## 2016-04-29 NOTE — Progress Notes (Signed)
Patient ID: Lauren Mccormick, female   DOB: 1965-05-23, 50 y.o.   MRN: [id number] [facility] MD Progress Note  04/29/2016 9:39 AM YAMILLET GOERINGER  MRN:  [id number] Subjective:  [place] Problem: Major depression, recurrent, remission Diagnosis:  Major depression, recurrent, remission  Today the patient is doing very well. She says she feels better than even her last visit. Between the time I saw her last however she did have a drop into depression. Apparently the place she was living was too loud. She lives at the [facility] but found a lot of the people to be plain and music too loud and via noxious. At some point in the last 6 months her stepfather died and the patient left the [place] and moved in with her mother. She feels much better. She admits that the environment is very important and she feels much better. She has had some health issues though. He's had weight loss and shortness of breath. She's had a CAT scan that showed a small lesion which the doctors wish to rescan in a few months. The patient takes something that she got over the Internet that she says helps her breathing. In terms of emotions the patient denies daily depression. She is sleeping and eating fairly well. Her energy level is somewhat of an issue. The patient loves being on the computer. She researches. She loves to read has a dog and feels good about life. She be open to romance. She was dating a little on line. The patient has a number medical illnesses including non-insulin-dependent diabetes and hypertension seem to be fairly well controlled. It is noted the patient has had 5 psychiatric hospitalizations in the past. 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 Today she acknowledges that she has no stresses. She's doing really well. She denies any psychotic symptoms at all. The patient denies being suicidal. She does admit that in the past she did make a suicide attempt that is way in the  past. The patient just takes Cymbalta and takes it regularly. Patient Active Problem List   Diagnosis Date Noted  . Cough [R05] 11/22/2015  . Diabetes mellitus, type 2 ([place]) [E11.9] 01/02/2015  . Essential hypertension, benign [I10] 01/02/2015  . Tachycardia [R00.0] 01/02/2015  . GAD (generalized anxiety disorder) [F41.1] 10/08/2013  . Social anxiety disorder [F40.10] 10/08/2013  . Major depression [F32.9] 10/05/2013  . Suicidal ideations [R45.851] 10/05/2013  . MDD (major depressive disorder), recurrent episode, severe ([place]) [F33.2] 10/05/2013  . Dyspnea [R06.00] 08/12/2013  . Morbid obesity ([place]) [E66.01] 08/12/2013   Total Time spent with patient: 15 minutes  Past Psychiatric History:   Past Medical History:  Past Medical History:  Diagnosis Date  . Anemia   . Depression   . Diabetes mellitus without complication ([place])   . Diabetes mellitus, type II ([place])   . Hypertension   . Tachycardia 2012   Periodic problems with tachycardia with undetermined cause    Past Surgical History:  Procedure Laterality Date  . bean in ears     childhood   Family History:  Family History  Problem Relation Age of Onset  . Hypertension Mother   . Heart disease Father   . Hypertension Father   . Stroke Father   . Alcohol abuse Father   . Diabetes Sister   . Anxiety disorder Brother   . Depression Brother   . Drug abuse Cousin   . Colon polyps Neg Hx    Family  Psychiatric  History:  Social History:  History  Alcohol Use No     History  Drug Use No    Social History   Social History  . Marital status: Single    Spouse name: N/A  . Number of children: N/A  . Years of education: N/A   Social History Main Topics  . Smoking status: Never Smoker  . Smokeless tobacco: Never Used  . Alcohol use No  . Drug use: No  . Sexual activity: Not Currently   Other Topics Concern  . None   Social History Narrative   Diet:    Do you drink/eat things with caffeine? Yes   Marital  status: No                              What year were you married?    Do you live in a house, apartment, assisted living, condo, trailer, etc)? Apartment   Is it one or more stories?    How many persons live in your home?    Do you have any pets in your home? Dog   Current or past profession:    Do you exercise?    No                                                Type & how often:   Do you have a living will? No   Do you have a DNR Form?  No                                                                                                                   Do you have a POA/HPOA forms? No   Additional Social History:                         Sleep: Good  Appetite:  Good  Current Medications: Current Outpatient Prescriptions  Medication Sig Dispense Refill  . aspirin 81 MG chewable tablet Chew 1 tablet (81 mg total) by mouth daily. For heart health    . atenolol (TENORMIN) 50 MG tablet Take 1 tablet (50 mg total) by mouth daily. 120 tablet 1  . DULoxetine (CYMBALTA) 60 MG capsule Take 1 capsule (60 mg total) by mouth daily. 30 capsule 8  . famotidine (PEPCID) 20 MG tablet Take 1 tablet (20 mg total) by mouth 2 (two) times daily. For acid reflux    . lisinopril (PRINIVIL,ZESTRIL) 20 MG tablet Take 1 tablet (20 mg total) by mouth daily. For high blood pressure 30 tablet 3  . lisinopril-hydrochlorothiazide (ZESTORETIC) 20-12.5 MG tablet Take 1 tablet by mouth daily. 30 tablet 6  . metFORMIN (GLUCOPHAGE) 500 MG tablet Take 1 tablet (500 mg total) by mouth 2 (two) times daily with a meal. 60 tablet 4  .  Multiple Vitamin (MULTIVITAMIN WITH MINERALS) TABS tablet Take 1 tablet by mouth daily. For low vitamin    . sitaGLIPtin (JANUVIA) 100 MG tablet Take 1 tablet (100 mg total) by mouth daily. For diabetes 90 tablet 1  . Tdap (BOOSTRIX) 5-2.5-18.5 LF-MCG/0.5 injection Inject 0.5 mLs into the muscle once. 0.5 mL 0   No current facility-administered medications for this visit.     Lab  Results: No results found for this or any previous visit (from the past 48 hour(s)).  Physical Findings: AIMS:  , ,  ,  ,    CIWA:    COWS:     Musculoskeletal: Strength & Muscle Tone: within normal limits Gait & Station: normal Patient leans: Right  Psychiatric Specialty Exam: ROS  Blood pressure 102/68, pulse 97, height 5\' 3"  (1.6 m), weight 220 lb 12.8 oz (100.2 kg).Body mass index is 39.11 kg/m.  General Appearance: Casual  Eye Contact::  Good  Speech:  Clear and Coherent  Volume:  Normal  Mood:  Euthymic  Affect:  Negative  Thought Process:  Coherent  Orientation:  Full (Time, Place, and Person)  Thought Content:  WDL  Suicidal Thoughts:  No  Homicidal Thoughts:  No  Memory:  NA  Judgement:  Good  Insight:  Good  Psychomotor Activity:  Normal  Concentration:  NA  Recall:  Good  Fund of Knowledge:Good  Language: Good  Akathisia:  No  Handed:  Right  AIMS (if indicated):     Assets:  Communication Skills  ADL's:  Intact  Cognition: WNL  Sleep:      Treatment Plan Summary: At this time this patient's #1 problem is that of depression. I suspect that there were times of the depression was so bad she was psychotic. She said at one point she was paranoid. She is never hallucinated. But she's required multiple psychiatric hospitalizations and she denied agreed our goal is to keep her well do any hospitalizations. Her last hospitalization was actually at [facility]. Today reviewed a new plan that should she have a stress that she contact us immediately and I will see her and will set her up with a therapist. At this time she is no needs to see a therapist. She takes her medicine as prescribed and it seems to be working quite well. She takes Cymbalta 60 mg. She doesn't take any Seroquel. She denies anxiety. She denies psychosis. And she certainly denies being suicidal. She'll return to see me in 6 months for a 30 minute visit. Charlestine Night [facility] 04/29/2016,  9:39 AM

## 2016-05-13 ENCOUNTER — Telehealth: Payer: Self-pay | Admitting: Internal Medicine

## 2016-05-13 NOTE — Telephone Encounter (Signed)
Left msg asking pt to confirm this AWV appt w/ nurse. VDM (DD)

## 2016-05-30 ENCOUNTER — Other Ambulatory Visit: Payer: Self-pay | Admitting: *Deleted

## 2016-05-30 DIAGNOSIS — I10 Essential (primary) hypertension: Secondary | ICD-10-CM

## 2016-05-30 DIAGNOSIS — E119 Type 2 diabetes mellitus without complications: Secondary | ICD-10-CM

## 2016-05-30 MED ORDER — ATENOLOL 50 MG PO TABS
50.0000 mg | ORAL_TABLET | Freq: Every day | ORAL | 1 refills | Status: DC
Start: 2016-05-30 — End: 2016-11-07

## 2016-05-30 MED ORDER — LISINOPRIL-HYDROCHLOROTHIAZIDE 20-12.5 MG PO TABS: 1.0000 | ORAL_TABLET | Freq: Every day | ORAL | 1 refills | Status: DC

## 2016-05-30 MED ORDER — SITAGLIPTIN PHOSPHATE 100 MG PO TABS: 100.0000 mg | ORAL_TABLET | Freq: Every day | ORAL | 1 refills | Status: DC

## 2016-05-30 NOTE — Telephone Encounter (Signed)
[facility] 

## 2016-06-08 ENCOUNTER — Ambulatory Visit: Payer: Commercial Managed Care - HMO

## 2016-06-17 ENCOUNTER — Ambulatory Visit: Payer: Commercial Managed Care - HMO

## 2016-06-24 ENCOUNTER — Other Ambulatory Visit: Payer: Commercial Managed Care - HMO

## 2016-06-29 ENCOUNTER — Ambulatory Visit: Payer: Commercial Managed Care - HMO | Admitting: Internal Medicine

## 2016-07-13 ENCOUNTER — Telehealth: Payer: Self-pay | Admitting: Internal Medicine

## 2016-07-13 NOTE — Telephone Encounter (Signed)
left msg asking pt to call and reschedule AWV that's due now.  VDM (DD)

## 2016-07-27 NOTE — Telephone Encounter (Signed)
07/27/16 left msg asking pt to confirm this AWV appt w/ nurse. VDM (DD)

## 2016-08-31 ENCOUNTER — Other Ambulatory Visit: Payer: Self-pay | Admitting: *Deleted

## 2016-08-31 DIAGNOSIS — I10 Essential (primary) hypertension: Secondary | ICD-10-CM

## 2016-08-31 MED ORDER — LISINOPRIL-HYDROCHLOROTHIAZIDE 20-12.5 MG PO TABS: 1.0000 | ORAL_TABLET | Freq: Every day | ORAL | 1 refills | Status: DC

## 2016-08-31 NOTE — Telephone Encounter (Signed)
Lauren Mccormick

## 2016-10-05 ENCOUNTER — Other Ambulatory Visit: Payer: Self-pay | Admitting: *Deleted

## 2016-10-05 DIAGNOSIS — E119 Type 2 diabetes mellitus without complications: Secondary | ICD-10-CM

## 2016-10-05 MED ORDER — METFORMIN HCL 500 MG PO TABS: 500.0000 mg | ORAL_TABLET | Freq: Two times a day (BID) | ORAL | 0 refills | Status: DC

## 2016-10-05 NOTE — Telephone Encounter (Signed)
[place] [phone]

## 2016-10-12 ENCOUNTER — Ambulatory Visit (INDEPENDENT_AMBULATORY_CARE_PROVIDER_SITE_OTHER): Payer: Medicare HMO

## 2016-10-12 ENCOUNTER — Other Ambulatory Visit: Payer: Medicare HMO

## 2016-10-12 VITALS — BP 110/80 | HR 92 | Temp 98.3°F | Ht 63.0 in | Wt 237.0 lb

## 2016-10-12 DIAGNOSIS — E119 Type 2 diabetes mellitus without complications: Secondary | ICD-10-CM | POA: Diagnosis not present

## 2016-10-12 DIAGNOSIS — Z0000 Encounter for general adult medical examination without abnormal findings: Secondary | ICD-10-CM | POA: Diagnosis not present

## 2016-10-12 LAB — BASIC METABOLIC PANEL WITH GFR
BUN: 6 mg/dL — ABNORMAL LOW (ref 7–25)
CO2: 27 mmol/L (ref 20–31)
Calcium: 8.9 mg/dL (ref 8.6–10.4)
Chloride: 102 mmol/L (ref 98–110)
Creat: 0.63 mg/dL (ref 0.50–1.05)
GLUCOSE: 157 mg/dL — AB (ref 65–99)
POTASSIUM: 4.2 mmol/L (ref 3.5–5.3)
SODIUM: 139 mmol/L (ref 135–146)

## 2016-10-12 LAB — ALT: ALT: 10 U/L (ref 6–29)

## 2016-10-12 MED ORDER — TETANUS-DIPHTH-ACELL PERTUSSIS 5-2.5-18.5 LF-MCG/0.5 IM SUSP: 0.5000 mL | Freq: Once | INTRAMUSCULAR | 0 refills | Status: AC

## 2016-10-12 NOTE — Progress Notes (Signed)
Subjective:   Lauren Mccormick is a 51 y.o. female who presents for an Initial Medicare Annual Wellness Visit.      Objective:    Today's Vitals   10/12/16 1132  BP: 110/80  Pulse: 92  Temp: 98.3 F (36.8 C)  TempSrc: Oral  SpO2: 98%  Weight: 237 lb (107.5 kg)  Height: 5\' 3"  (1.6 m)   Body mass index is 41.98 kg/m.   Current Medications (verified) Outpatient Encounter Prescriptions as of 10/12/2016  Medication Sig  . aspirin 81 MG chewable tablet Chew 1 tablet (81 mg total) by mouth daily. For heart health  . atenolol (TENORMIN) 50 MG tablet Take 1 tablet (50 mg total) by mouth daily.  . DULoxetine (CYMBALTA) 60 MG capsule Take 1 capsule (60 mg total) by mouth daily.  . famotidine (PEPCID) 20 MG tablet Take 1 tablet (20 mg total) by mouth 2 (two) times daily. For acid reflux  . lisinopril (PRINIVIL,ZESTRIL) 20 MG tablet Take 1 tablet (20 mg total) by mouth daily. For high blood pressure  . lisinopril-hydrochlorothiazide (ZESTORETIC) 20-12.5 MG tablet Take 1 tablet by mouth daily.  . metFORMIN (GLUCOPHAGE) 500 MG tablet Take 1 tablet (500 mg total) by mouth 2 (two) times daily with a meal.  . Multiple Vitamin (MULTIVITAMIN WITH MINERALS) TABS tablet Take 1 tablet by mouth daily. For low vitamin  . sitaGLIPtin (JANUVIA) 100 MG tablet Take 1 tablet (100 mg total) by mouth daily. For diabetes  . Tdap (BOOSTRIX) 5-2.5-18.5 LF-MCG/0.5 injection Inject 0.5 mLs into the muscle once.  . [DISCONTINUED] Tdap (BOOSTRIX) 5-2.5-18.5 LF-MCG/0.5 injection Inject 0.5 mLs into the muscle once.   No facility-administered encounter medications on file as of 10/12/2016.     Allergies (verified) Patient has no known allergies.   History: Past Medical History:  Diagnosis Date  . Anemia   . Depression   . Diabetes mellitus without complication ([place])   . Diabetes mellitus, type II ([place])   . Hypertension   . Tachycardia 2012   Periodic problems with tachycardia with undetermined cause    Past Surgical History:  Procedure Laterality Date  . bean in ears     childhood   Family History  Problem Relation Age of Onset  . Hypertension Mother   . Heart disease Father   . Hypertension Father   . Stroke Father   . Alcohol abuse Father   . Diabetes Sister   . Anxiety disorder Brother   . Depression Brother   . Drug abuse Cousin   . Colon polyps Neg Hx    Social History   Occupational History  . Not on file.   Social History Main Topics  . Smoking status: Never Smoker  . Smokeless tobacco: Never Used  . Alcohol use No  . Drug use: No  . Sexual activity: Not Currently    Tobacco Counseling Counseling given: Not Answered   Activities of Daily Living In your present state of health, do you have any difficulty performing the following activities: 10/12/2016  Hearing? Y  Vision? Y  Difficulty concentrating or making decisions? Y  Walking or climbing stairs? N  Dressing or bathing? N  Doing errands, shopping? N  Preparing Food and eating ? N  Using the Toilet? N  In the past six months, have you accidently leaked urine? Y  Do you have problems with loss of bowel control? N  Managing your Medications? N  Managing your Finances? N  Housekeeping or managing your Housekeeping? N  Some  recent data might be hidden    Immunizations and Health Maintenance Immunization History  Administered Date(s) Administered  . Influenza,inj,Quad PF,36+ Mos 03/25/2016  . Influenza-Unspecified 01/08/2015  . Pneumococcal Polysaccharide-23 10/06/2013   Health Maintenance Due  Topic Date Due  . OPHTHALMOLOGY EXAM  05/16/1974  . HIV Screening  05/17/1979  . TETANUS/TDAP  05/17/1983  . FOOT EXAM  01/02/2016  . HEMOGLOBIN A1C  09/20/2016    Patient Care Team: Gildardo Cranker, DO as PCP - General (Internal Medicine)  Indicate any recent Medical Services you may have received from other than [facility] providers in the past year (date may be approximate).     Assessment:   This  is a routine wellness examination for Lauren Mccormick.   Hearing/Vision screen No exam data present  Dietary issues and exercise activities discussed: Current Exercise Habits: The patient does not participate in regular exercise at present, Exercise limited by: None identified  Goals    . Walking          Pt would like to walk 10 minutes every day around the block.      Depression Screen PHQ 2/9 Scores 10/12/2016 02/20/2015  PHQ - 2 Score 4 0  PHQ- 9 Score 10 -    Fall Risk Fall Risk  10/12/2016 11/20/2015 05/29/2015 02/20/2015 01/02/2015  Falls in the past year? No No No No No    Cognitive Function:        Screening Tests Health Maintenance  Topic Date Due  . OPHTHALMOLOGY EXAM  05/16/1974  . HIV Screening  05/17/1979  . TETANUS/TDAP  05/17/1983  . FOOT EXAM  01/02/2016  . HEMOGLOBIN A1C  09/20/2016  . INFLUENZA VACCINE  12/07/2016  . MAMMOGRAM  06/02/2017  . PAP SMEAR  02/19/2018  . PNEUMOCOCCAL POLYSACCHARIDE VACCINE (2) 10/07/2018  . COLONOSCOPY  06/03/2020      Plan:    I have personally reviewed and addressed the Medicare Annual Wellness questionnaire and have noted the following in the patient's chart:  A. Medical and social history B. Use of alcohol, tobacco or illicit drugs  C. Current medications and supplements D. Functional ability and status E.  Nutritional status F.  Physical activity G. Advance directives H. List of other physicians I.  Hospitalizations, surgeries, and ER visits in previous 12 months J.  [place] to include hearing, vision, cognitive, depression L. Referrals and appointments - none  In addition, I have reviewed and discussed with patient certain preventive protocols, quality metrics, and best practice recommendations. A written personalized care plan for preventive services as well as general preventive health recommendations were provided to patient.  See attached scanned questionnaire for additional information.   Signed,    Rich Reining, RN Nurse Health Advisor   Quick Notes   Health Maintenance: Foot exam due. Eye exam due-putting in referral.     Abnormal Screen: pHQ-10     Patient Concerns: Stomach always feeling bloated and full. Possibly interested in diagnostic testing. Pt also feels SOB and weakness and walking. Pain in upper back     Nurse Concerns: None

## 2016-10-12 NOTE — Patient Instructions (Signed)
[name] , Thank you for taking time to come for your Medicare Wellness Visit. I appreciate your ongoing commitment to your health goals. Please review the following plan we discussed and let me know if I can assist you in the future.   Screening recommendations/referrals: Colonoscopy up to date. Due 06/03/2025 Mammogram up to date. Due 06/15/2017 Bone Density up to date Recommended yearly ophthalmology/optometry visit for glaucoma screening and checkup Recommended yearly dental visit for hygiene and checkup  Vaccinations: Influenza vaccine up to date. Due 03/25/2017 Pneumococcal vaccine due at age 50 Tdap vaccine Due Shingles vaccine due at age 73   Advanced directives: Advance directive discussed with you today. I have provided a copy for you to complete at home and have notarized. Once this is complete please bring a copy in to our office so we can scan it into your chart.   Conditions/risks identified: None  Next appointment: Dr Eulas Post 10/21/16 @ 11:30am  Preventive Care 40-64 Years, Female Preventive care refers to lifestyle choices and visits with your health care provider that can promote health and wellness. What does preventive care include?  A yearly physical exam. This is also called an annual well check.  Dental exams once or twice a year.  Routine eye exams. Ask your health care provider how often you should have your eyes checked.  Personal lifestyle choices, including:  Daily care of your teeth and gums.  Regular physical activity.  Eating a healthy diet.  Avoiding tobacco and drug use.  Limiting alcohol use.  Practicing safe sex.  Taking low-dose aspirin daily starting at age 18.  Taking vitamin and mineral supplements as recommended by your health care provider. What happens during an annual well check? The services and screenings done by your health care provider during your annual well check will depend on your age, overall health, lifestyle risk  factors, and family history of disease. Counseling  Your health care provider may ask you questions about your:  Alcohol use.  Tobacco use.  Drug use.  Emotional well-being.  Home and relationship well-being.  Sexual activity.  Eating habits.  Work and work Statistician.  Method of birth control.  Menstrual cycle.  Pregnancy history. Screening  You may have the following tests or measurements:  Height, weight, and BMI.  Blood pressure.  Lipid and cholesterol levels. These may be checked every 5 years, or more frequently if you are over 83 years old.  Skin check.  Lung cancer screening. You may have this screening every year starting at age 40 if you have a 30-pack-year history of smoking and currently smoke or have quit within the past 15 years.  Fecal occult blood test (FOBT) of the stool. You may have this test every year starting at age 99.  Flexible sigmoidoscopy or colonoscopy. You may have a sigmoidoscopy every 5 years or a colonoscopy every 10 years starting at age 64.  Hepatitis C blood test.  Hepatitis B blood test.  Sexually transmitted disease (STD) testing.  Diabetes screening. This is done by checking your blood sugar (glucose) after you have not eaten for a while (fasting). You may have this done every 1-3 years.  Mammogram. This may be done every 1-2 years. Talk to your health care provider about when you should start having regular mammograms. This may depend on whether you have a family history of breast cancer.  BRCA-related cancer screening. This may be done if you have a family history of breast, ovarian, tubal, or peritoneal cancers.  Pelvic exam and Pap test. This may be done every 3 years starting at age 35. Starting at age 45, this may be done every 5 years if you have a Pap test in combination with an HPV test.  Bone density scan. This is done to screen for osteoporosis. You may have this scan if you are at high risk for  osteoporosis. Discuss your test results, treatment options, and if necessary, the need for more tests with your health care provider. Vaccines  Your health care provider may recommend certain vaccines, such as:  Influenza vaccine. This is recommended every year.  Tetanus, diphtheria, and acellular pertussis (Tdap, Td) vaccine. You may need a Td booster every 10 years.  Zoster vaccine. You may need this after age 43.  Pneumococcal 13-valent conjugate (PCV13) vaccine. You may need this if you have certain conditions and were not previously vaccinated.  Pneumococcal polysaccharide (PPSV23) vaccine. You may need one or two doses if you smoke cigarettes or if you have certain conditions. Talk to your health care provider about which screenings and vaccines you need and how often you need them. This information is not intended to replace advice given to you by your health care provider. Make sure you discuss any questions you have with your health care provider. Document Released: 05/22/2015 Document Revised: 01/13/2016 Document Reviewed: 02/24/2015 Elsevier Interactive Patient Education  2017 [employer] Prevention in the Home Falls can cause injuries. They can happen to people of all ages. There are many things you can do to make your home safe and to help prevent falls. What can I do on the outside of my home?  Regularly fix the edges of walkways and driveways and fix any cracks.  Remove anything that might make you trip as you walk through a door, such as a raised step or threshold.  Trim any bushes or trees on the path to your home.  Use bright outdoor lighting.  Clear any walking paths of anything that might make someone trip, such as rocks or tools.  Regularly check to see if handrails are loose or broken. Make sure that both sides of any steps have handrails.  Any raised decks and porches should have guardrails on the edges.  Have any leaves, snow, or ice cleared  regularly.  Use sand or salt on walking paths during winter.  Clean up any spills in your garage right away. This includes oil or grease spills. What can I do in the bathroom?  Use night lights.  Install grab bars by the toilet and in the tub and shower. Do not use towel bars as grab bars.  Use non-skid mats or decals in the tub or shower.  If you need to sit down in the shower, use a plastic, non-slip stool.  Keep the floor dry. Clean up any water that spills on the floor as soon as it happens.  Remove soap buildup in the tub or shower regularly.  Attach bath mats securely with double-sided non-slip rug tape.  Do not have throw rugs and other things on the floor that can make you trip. What can I do in the bedroom?  Use night lights.  Make sure that you have a light by your bed that is easy to reach.  Do not use any sheets or blankets that are too big for your bed. They should not hang down onto the floor.  Have a firm chair that has side arms. You can use this for  support while you get dressed.  Do not have throw rugs and other things on the floor that can make you trip. What can I do in the kitchen?  Clean up any spills right away.  Avoid walking on wet floors.  Keep items that you use a lot in easy-to-reach places.  If you need to reach something above you, use a strong step stool that has a grab bar.  Keep electrical cords out of the way.  Do not use floor polish or wax that makes floors slippery. If you must use wax, use non-skid floor wax.  Do not have throw rugs and other things on the floor that can make you trip. What can I do with my stairs?  Do not leave any items on the stairs.  Make sure that there are handrails on both sides of the stairs and use them. Fix handrails that are broken or loose. Make sure that handrails are as long as the stairways.  Check any carpeting to make sure that it is firmly attached to the stairs. Fix any carpet that is loose  or worn.  Avoid having throw rugs at the top or bottom of the stairs. If you do have throw rugs, attach them to the floor with carpet tape.  Make sure that you have a light switch at the top of the stairs and the bottom of the stairs. If you do not have them, ask someone to add them for you. What else can I do to help prevent falls?  Wear shoes that:  Do not have high heels.  Have rubber bottoms.  Are comfortable and fit you well.  Are closed at the toe. Do not wear sandals.  If you use a stepladder:  Make sure that it is fully opened. Do not climb a closed stepladder.  Make sure that both sides of the stepladder are locked into place.  Ask someone to hold it for you, if possible.  Clearly mark and make sure that you can see:  Any grab bars or handrails.  First and last steps.  Where the edge of each step is.  Use tools that help you move around (mobility aids) if they are needed. These include:  Canes.  Walkers.  Scooters.  Crutches.  Turn on the lights when you go into a dark area. Replace any light bulbs as soon as they burn out.  Set up your furniture so you have a clear path. Avoid moving your furniture around.  If any of your floors are uneven, fix them.  If there are any pets around you, be aware of where they are.  Review your medicines with your doctor. Some medicines can make you feel dizzy. This can increase your chance of falling. Ask your doctor what other things that you can do to help prevent falls. This information is not intended to replace advice given to you by your health care provider. Make sure you discuss any questions you have with your health care provider. Document Released: 02/19/2009 Document Revised: 10/01/2015 Document Reviewed: 05/30/2014 Elsevier Interactive Patient Education  2017 [employer].

## 2016-10-13 LAB — HEMOGLOBIN A1C
HEMOGLOBIN A1C: 7.5 % — AB (ref <5.7)
MEAN PLASMA GLUCOSE: 169 mg/dL

## 2016-10-18 ENCOUNTER — Other Ambulatory Visit: Payer: Commercial Managed Care - HMO

## 2016-10-19 ENCOUNTER — Ambulatory Visit (HOSPITAL_COMMUNITY): Payer: Commercial Managed Care - HMO | Admitting: Psychiatry

## 2016-10-21 ENCOUNTER — Ambulatory Visit: Payer: Commercial Managed Care - HMO

## 2016-10-21 ENCOUNTER — Ambulatory Visit (INDEPENDENT_AMBULATORY_CARE_PROVIDER_SITE_OTHER): Payer: Medicare HMO | Admitting: Internal Medicine

## 2016-10-21 ENCOUNTER — Encounter: Payer: Self-pay | Admitting: Internal Medicine

## 2016-10-21 VITALS — BP 132/88 | HR 84 | Temp 98.3°F | Ht 63.0 in | Wt 236.0 lb

## 2016-10-21 DIAGNOSIS — R29898 Other symptoms and signs involving the musculoskeletal system: Secondary | ICD-10-CM | POA: Diagnosis not present

## 2016-10-21 DIAGNOSIS — F411 Generalized anxiety disorder: Secondary | ICD-10-CM | POA: Diagnosis not present

## 2016-10-21 DIAGNOSIS — I10 Essential (primary) hypertension: Secondary | ICD-10-CM | POA: Diagnosis not present

## 2016-10-21 DIAGNOSIS — J302 Other seasonal allergic rhinitis: Secondary | ICD-10-CM

## 2016-10-21 DIAGNOSIS — L299 Pruritus, unspecified: Secondary | ICD-10-CM | POA: Diagnosis not present

## 2016-10-21 DIAGNOSIS — F331 Major depressive disorder, recurrent, moderate: Secondary | ICD-10-CM

## 2016-10-21 DIAGNOSIS — R0609 Other forms of dyspnea: Secondary | ICD-10-CM | POA: Diagnosis not present

## 2016-10-21 DIAGNOSIS — E119 Type 2 diabetes mellitus without complications: Secondary | ICD-10-CM

## 2016-10-21 MED ORDER — CETIRIZINE HCL 10 MG PO TABS: 10.0000 mg | ORAL_TABLET | Freq: Every day | ORAL | 11 refills | Status: AC

## 2016-10-21 NOTE — Patient Instructions (Addendum)
Recommend plain OTC zyrtec daily for itching and runny nose  Continue other medications as ordered  Follow up with specialists as scheduled  Follow up in 3 mos for DM, depression, HTN. Fasting lab prior to appt

## 2016-10-21 NOTE — Progress Notes (Signed)
Patient ID: Lauren Mccormick, female   DOB: 1965/11/21, 51 y.o.   MRN: [id number]    Location:  [facility] Place of Service: OFFICE  Chief Complaint  Patient presents with  . Medical Management of Chronic Issues    3 month medication management    HPI:  51 yo female seen today for f/u. She reports her skin is itching more x 2-3 yrs. She noticed she is more sensitive to soaps/detergents. She has rhinorrhea. She has tried nothing. No rash, f/c.  Dyspnea - She has intermittent SOB with associated weakness x 6-45yr. Recent CT chest without contrast revealed 752mground glass nodule but no other parenchymal changes and no other changes in chest. Nonsmoker. occasional dizziness with SOB. No HA. BP improved today on lisinopril hct. 2D echo in 2015 revealed LA mildly dilated; EF 55-60%. She saw cardio Dr NaAcie Fredricksonn 2015 but prefers to see a different provider if she needs to be referred again.CXR in 2015 was neg for acute process. She has worked in a [employer] 8 yrs and her SOB began after working there. She fed the cotton into the machine. She did wear a mask most times. No hemoptysis, night sweats, weight loss. She notes dry cough and occasional chest pain.  DM - borderline controlled. she does not check BS at home on a regular basis but has been fluctuating 110-200s. No low BS reactions. She takes metformin and jaTongaOccasional numbness/tingling in feet/hands. Last eye exam in 07/2013. She does not have vision insurance yet. A1c 7.5%. LDL 80; HDL 55. She has gained 7 lbs since last OV  Depression - she takes cymbalta. seroquel was stopped a few mos ago due to sedation. She notes increased sexual desire worsening since seroquel stopped. Mood is stable overall. [facility]. No hallucinations. She has MDD (recurrent, severe), GAD and social anxiety d/o.   HTN - BP stable on lisinopril hct and atenolol. She takes ASA  8131maily  Tachycardia - she has frequent palpitations and reports past hx afib. She takes atenolol  She c/o right leg weakness that prevents her walking long distances without stopping to rest. No recent falls. She has low back pain. Occasional numbness in legs/feet. No loss of bowel/bladder control  Past Medical History:  Diagnosis Date  . Anemia   . Depression   . Diabetes [place] without complication ([place] . Diabetes mellitus, type II ([place]CC[place] . Hypertension   . Tachycardia 2012   Periodic problems with tachycardia with undetermined cause    Past Surgical History:  Procedure Laterality Date  . bean in ears     childhood    Patient Care Team: CarGildardo CrankerO as PCP - General (Internal Medicine)  Social History   Social History  . Marital status: Single    Spouse name: N/A  . Number of children: N/A  . Years of education: N/A   Occupational History  . Not on file.   Social History Main Topics  . Smoking status: Never Smoker  . Smokeless tobacco: Never Used  . Alcohol use No  . Drug use: No  . Sexual activity: Not Currently   Other Topics Concern  . Not on file   Social History Narrative   Diet:    Do you drink/eat things with caffeine? Yes   Marital status: No  What year were you married?    Do you live in a house, apartment, assisted living, condo, trailer, etc)? Apartment   Is it one or more stories?    How many persons live in your home?    Do you have any pets in your home? Dog   Current or past profession:    Do you exercise?    No                                                Type & how often:   Do you have a living will? No   Do you have a DNR Form?  No                                                                                                                   Do you have a POA/HPOA forms? No     reports that she has never smoked. She has never used smokeless tobacco. She reports that she does not drink  alcohol or use drugs.  Family History  Problem Relation Age of Onset  . Hypertension Mother   . Heart disease Father   . Hypertension Father   . Stroke Father   . Alcohol abuse Father   . Diabetes Sister   . Anxiety disorder Brother   . Depression Brother   . Drug abuse Cousin   . Colon polyps Neg Hx    Family Status  Relation Status  . Mother Deceased  . Father Deceased  . Sister Alive  . Brother Alive  . Cousin (Not Specified)  . Neg Hx (Not Specified)     No Known Allergies  Medications: Patient's Medications  New Prescriptions   No medications on file  Previous Medications   ASPIRIN 81 MG CHEWABLE TABLET    Chew 1 tablet (81 mg total) by mouth daily. For heart health   ATENOLOL (TENORMIN) 50 MG TABLET    Take 1 tablet (50 mg total) by mouth daily.   DULOXETINE (CYMBALTA) 60 MG CAPSULE    Take 1 capsule (60 mg total) by mouth daily.   LISINOPRIL (PRINIVIL,ZESTRIL) 20 MG TABLET    Take 1 tablet (20 mg total) by mouth daily. For high blood pressure   LISINOPRIL-HYDROCHLOROTHIAZIDE (ZESTORETIC) 20-12.5 MG TABLET    Take 1 tablet by mouth daily.   METFORMIN (GLUCOPHAGE) 500 MG TABLET    Take 1 tablet (500 mg total) by mouth 2 (two) times daily with a meal.   MULTIPLE VITAMIN (MULTIVITAMIN WITH MINERALS) TABS TABLET    Take 1 tablet by mouth daily. For low vitamin   SITAGLIPTIN (JANUVIA) 100 MG TABLET    Take 1 tablet (100 mg total) by mouth daily. For diabetes  Modified Medications   No medications on file  Discontinued Medications   FAMOTIDINE (PEPCID) 20 MG TABLET    Take 1 tablet (20  mg total) by mouth 2 (two) times daily. For acid reflux    Review of Systems  Unable to perform ROS: Psychiatric disorder    Vitals:   10/21/16 1144  BP: 132/88  Pulse: 84  Temp: 98.3 F (36.8 C)  TempSrc: Oral  SpO2: 98%  Weight: 236 lb (107 kg)  Height: 5' 3" (1.6 m)   Body mass index is 41.81 kg/m.  Physical Exam  Constitutional: She appears well-developed and  well-nourished.  HENT:  Mouth/Throat: Oropharynx is clear and moist. No oropharyngeal exudate.  Oropharynx with cobblestoning appearance but no exudate  Eyes: Pupils are equal, round, and reactive to light. No scleral icterus.  Neck: Neck supple. Carotid bruit is not present. No tracheal deviation present. No thyromegaly present.  Cardiovascular: Normal rate, regular rhythm, normal heart sounds and intact distal pulses.  Exam reveals no gallop and no friction rub.   No murmur heard. +1 pitting LE edema b/l. No calf TTP  Pulmonary/Chest: Effort normal and breath sounds normal. No stridor. No respiratory distress. She has no wheezes. She has no rales.  Abdominal: Soft. Bowel sounds are normal. She exhibits no distension and no mass. There is no hepatomegaly. There is no tenderness. There is no rebound and no guarding.  Musculoskeletal: She exhibits edema and tenderness.  paravertebral lumbar muscle hypertrophy with ropy tissue texture changes; increased lumbar lordosis  Lymphadenopathy:    She has no cervical adenopathy.  Neurological: She is alert.  Skin: Skin is warm and dry. No rash noted.     Psychiatric: She has a normal mood and affect. Her behavior is normal. Thought content normal.     Labs reviewed: Appointment on 10/12/2016  Component Date Value Ref Range Status  . Sodium 10/12/2016 139  135 - 146 mmol/L Final  . Potassium 10/12/2016 4.2  3.5 - 5.3 mmol/L Final  . Chloride 10/12/2016 102  98 - 110 mmol/L Final  . CO2 10/12/2016 27  20 - 31 mmol/L Final  . Glucose, Bld 10/12/2016 157* 65 - 99 mg/dL Final  . BUN 10/12/2016 6* 7 - 25 mg/dL Final  . Creat 10/12/2016 0.63  0.50 - 1.05 mg/dL Final   Comment:   For patients > or = 52 years of age: The upper reference limit for Creatinine is approximately 13% higher for people identified as African-American.     . Calcium 10/12/2016 8.9  8.6 - 10.4 mg/dL Final  . GFR, Est African American 10/12/2016 >89  >=60 mL/min Final  .  GFR, Est Non African American 10/12/2016 >89  >=60 mL/min Final  . ALT 10/12/2016 10  6 - 29 U/L Final  . Hgb A1c MFr Bld 10/12/2016 7.5* <5.7 % Final   Comment:   For someone without known diabetes, a hemoglobin A1c value of 6.5% or greater indicates that they may have diabetes and this should be confirmed with a follow-up test.   For someone with known diabetes, a value <7% indicates that their diabetes is well controlled and a value greater than or equal to 7% indicates suboptimal control. A1c targets should be individualized based on duration of diabetes, age, comorbid conditions, and other considerations.   Currently, no consensus exists for use of hemoglobin A1c for diagnosis of diabetes for children.     . Mean Plasma Glucose 10/12/2016 169  mg/dL Final    No results found.   Assessment/Plan   ICD-10-CM   1. Itching L29.9 cetirizine (ZYRTEC) 10 MG tablet  2. Seasonal allergic rhinitis, unspecified trigger J30.2  cetirizine (ZYRTEC) 10 MG tablet  3. Type 2 diabetes mellitus without complication, without long-term current use of insulin (HCC) E11.9 CBC with Differential/Platelets    CMP with eGFR    Lipid Panel    Microalbumin/Creatinine Ratio, Urine    Hemoglobin A1c    Urinalysis with Reflex Microscopic    CANCELED: CMP with eGFR    CANCELED: Lipid Panel    CANCELED: Microalbumin/Creatinine Ratio, Urine    CANCELED: Hemoglobin A1c    CANCELED: Urinalysis with Reflex Microscopic  4. Essential hypertension, benign I10 CBC with Differential/Platelets    CMP with eGFR    Urinalysis with Reflex Microscopic    CANCELED: CBC with Differential/Platelets  5. Dyspnea on exertion R06.09   6. GAD (generalized anxiety disorder) F41.1   7. Major depressive disorder, recurrent episode, moderate (HCC) F33.1 TSH    CANCELED: TSH  8. Weakness of both lower extremities R29.898      Recommend plain OTC zyrtec daily for itching and runny nose  Continue other medications as  ordered  Follow up with specialists as scheduled  Follow up in 3 mos for DM, depression, HTN. Fasting lab prior to appt   [facility] S. Perlie Gold  [facility] [address] [place], [place] [address] [phone] Cell (Monday-Friday 8 AM - 5 PM) [phone] After 5 PM and follow prompts

## 2016-11-07 ENCOUNTER — Other Ambulatory Visit: Payer: Self-pay | Admitting: *Deleted

## 2016-11-07 MED ORDER — ATENOLOL 50 MG PO TABS: 50.0000 mg | ORAL_TABLET | Freq: Every day | ORAL | 0 refills | Status: DC

## 2016-11-30 ENCOUNTER — Other Ambulatory Visit: Payer: Self-pay

## 2016-11-30 DIAGNOSIS — E119 Type 2 diabetes mellitus without complications: Secondary | ICD-10-CM

## 2016-11-30 MED ORDER — SITAGLIPTIN PHOSPHATE 100 MG PO TABS: 100.0000 mg | ORAL_TABLET | Freq: Every day | ORAL | 1 refills | Status: DC

## 2016-11-30 NOTE — Telephone Encounter (Signed)
A refill request was received from [facility] for Januvia 100 mg talbets. Rx was sent to pharmacy electronically for #[id number] with 1 RF

## 2016-12-30 IMAGING — US US PELVIS COMPLETE
1 series · 13 of 25 positions shown · non-contrast
Comparison: None

CLINICAL DATA: Pelvic pain and pressure for 17 years. Evaluate for
fibroids.



[Series 1: us pelvis complete · 0.33mm/px · 13 of 71 slices shown]
[im 1/71]
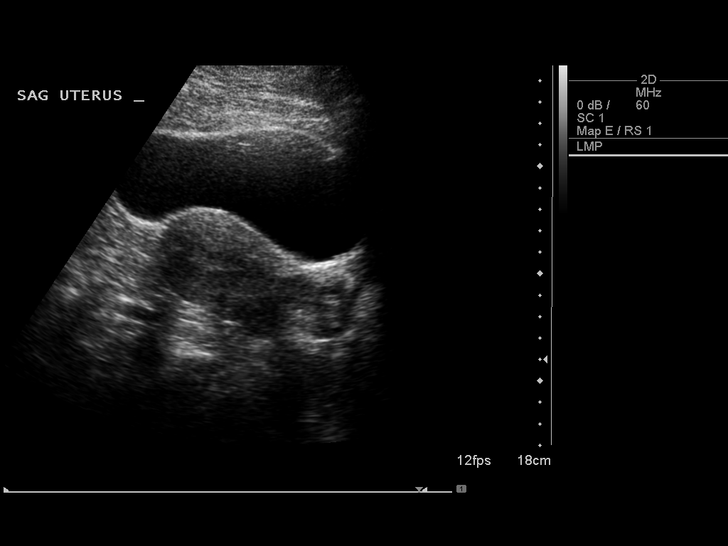
[im 6/71]
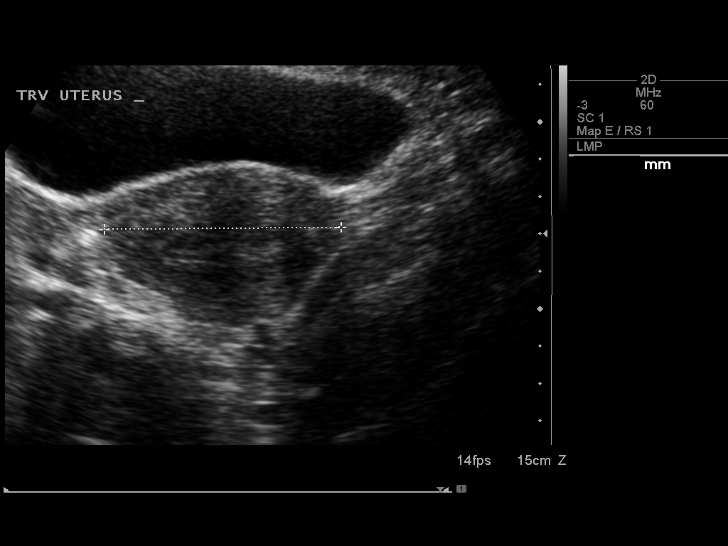
[im 12/71]
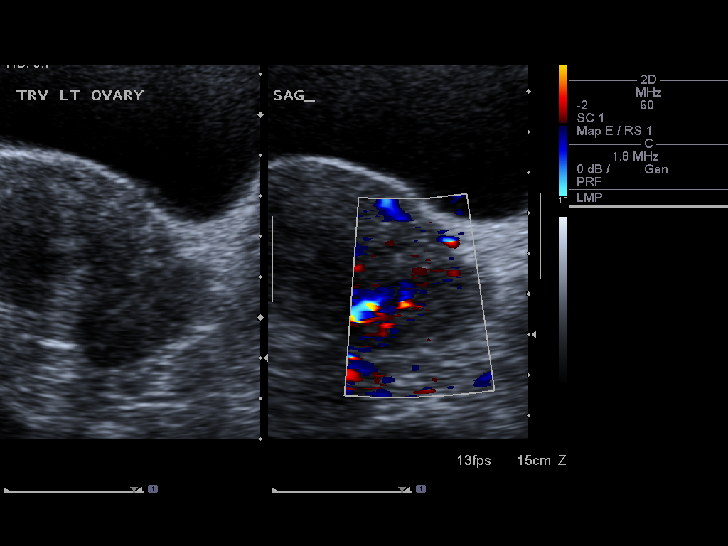
[im 18/71]
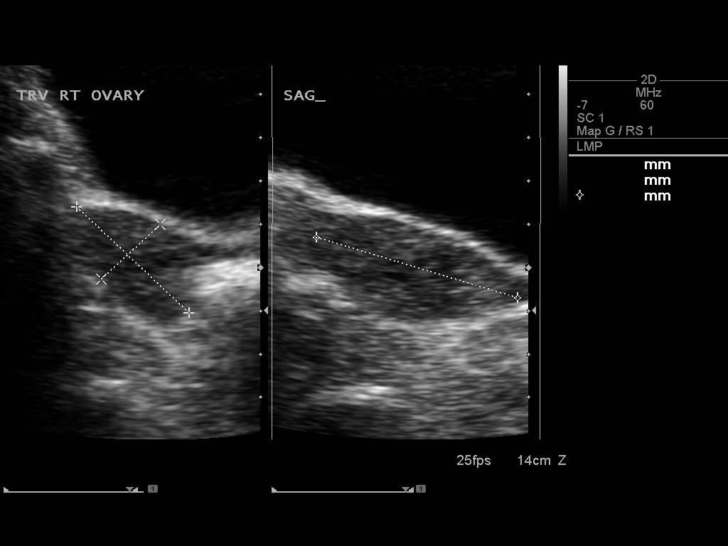
[im 24/71]
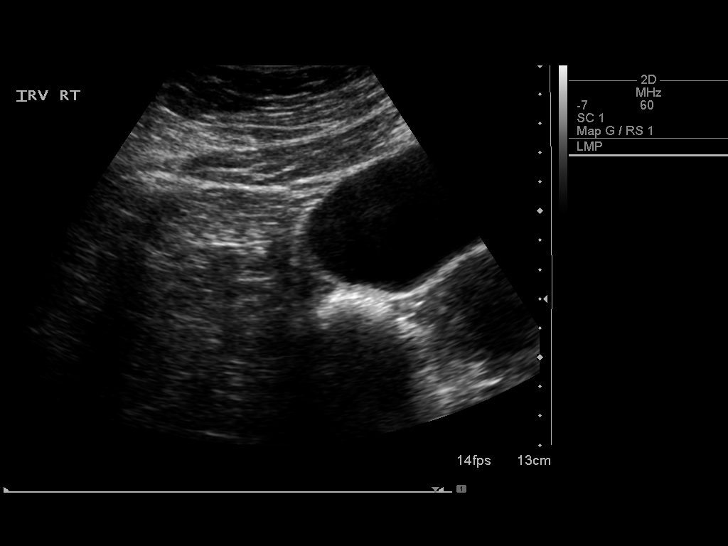
[im 30/71]
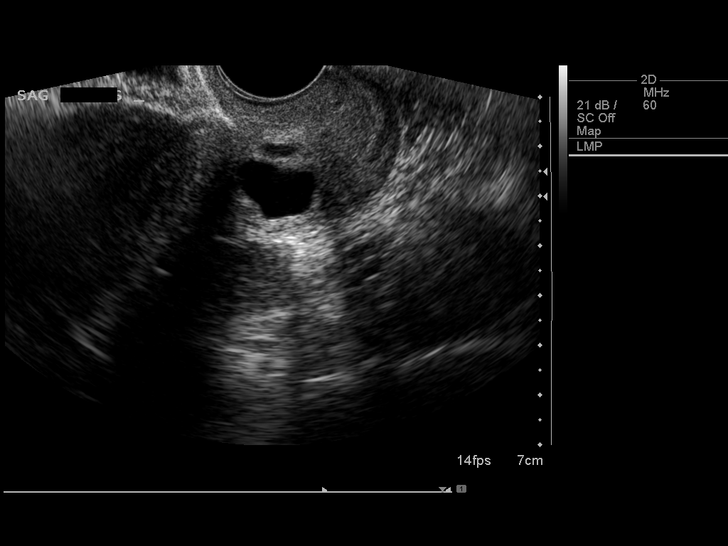
[im 36/71]
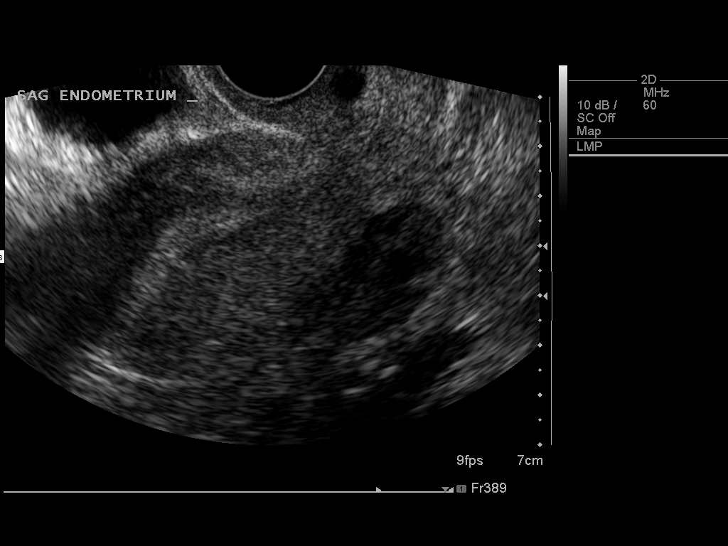
[im 41/71]
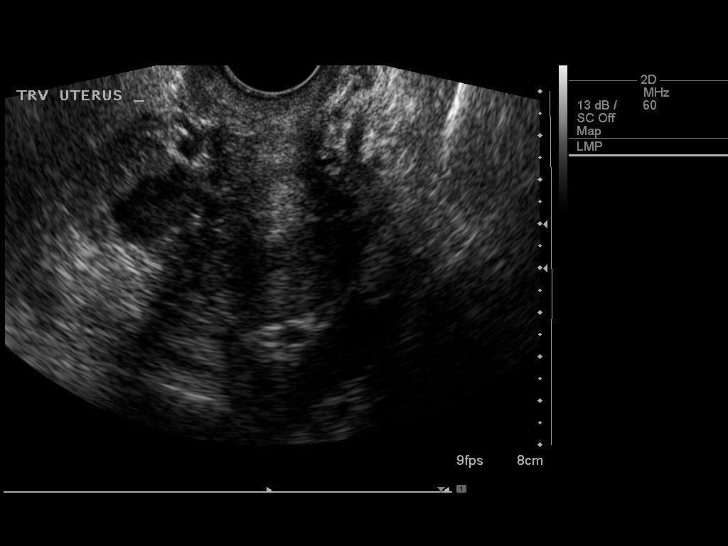
[im 47/71]
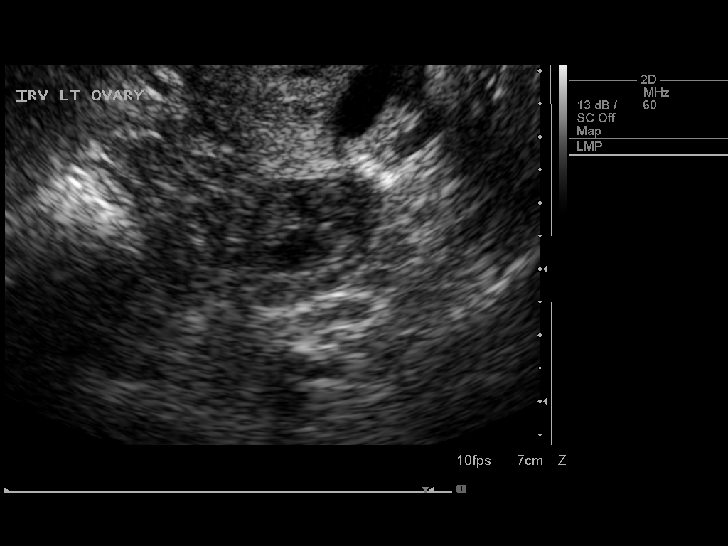
[im 53/71]
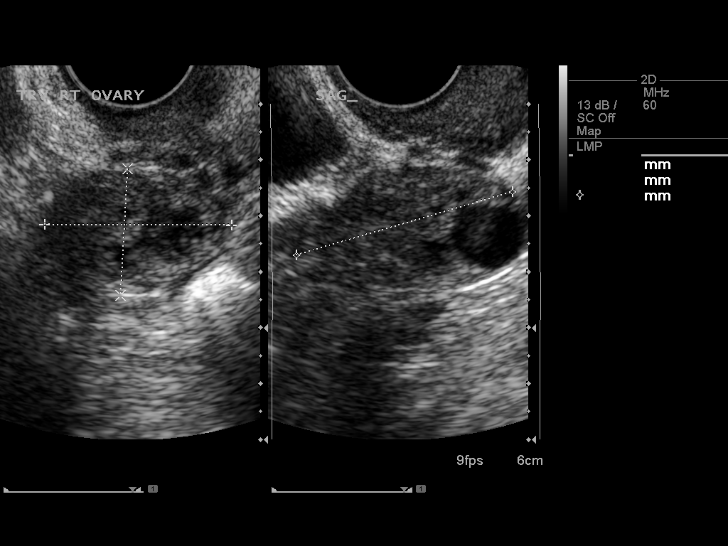
[im 59/71]
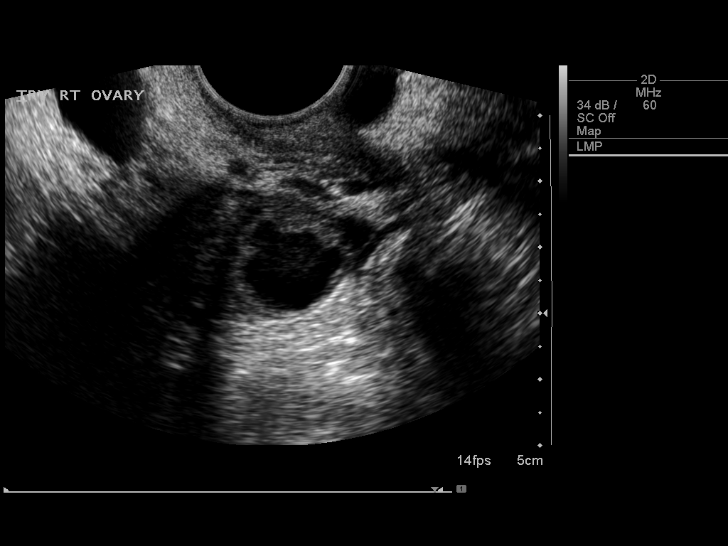
[im 65/71]
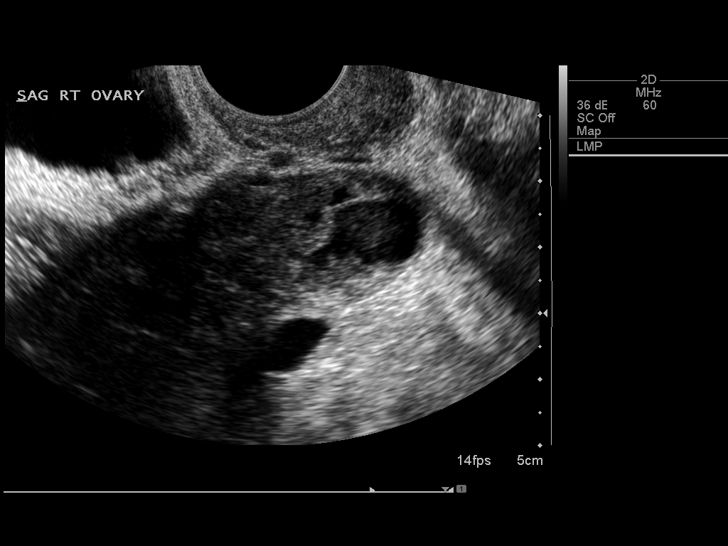
[im 71/71]
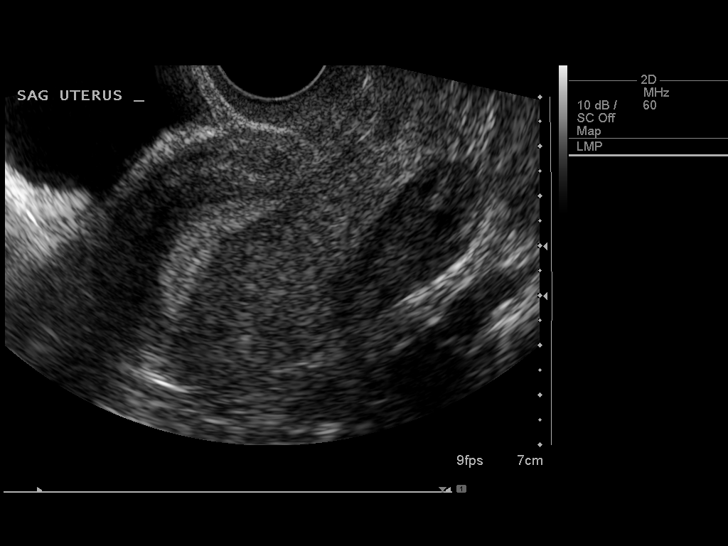

[13 of 25 positions shown; findings below may reference images not displayed]

FINDINGS: Uterus

Measurements: 5.2 x 4.9 x 5.9 cm. No fibroids or other mass
visualized.

Endometrium

Thickness: 10 mm.  No focal abnormality visualized.

Right ovary

Measurements: 4.0 x 2.3 x 3.3 cm. There is a complex solid and
cystic lesion in the right ovary measuring 2.0 x 1.3 x 1.4 cm. There
is some internal vascularity within the lesion.

Left ovary

Measurements: 3.6 x 1.7 x 3.5 cm. Normal appearance/no adnexal mass.

Other findings

No free fluid.
IMPRESSION: 2.0 cm complex solid and cystic lesion is present in the right
ovary. Followup ultrasound in 6-12 weeks is recommended to ensure
resolution.

No evidence of uterine fibroids or endometrial abnormality.

## 2017-01-20 ENCOUNTER — Other Ambulatory Visit: Payer: Medicare HMO

## 2017-01-20 ENCOUNTER — Other Ambulatory Visit: Payer: Self-pay

## 2017-01-24 ENCOUNTER — Ambulatory Visit: Payer: Medicare HMO | Admitting: Internal Medicine

## 2017-02-15 ENCOUNTER — Other Ambulatory Visit: Payer: No Typology Code available for payment source

## 2017-02-15 DIAGNOSIS — E119 Type 2 diabetes mellitus without complications: Secondary | ICD-10-CM

## 2017-02-15 DIAGNOSIS — I10 Essential (primary) hypertension: Secondary | ICD-10-CM | POA: Diagnosis not present

## 2017-02-15 DIAGNOSIS — F331 Major depressive disorder, recurrent, moderate: Secondary | ICD-10-CM

## 2017-02-16 LAB — CBC WITH DIFFERENTIAL/PLATELET
BASOS PCT: 0.4 %
Basophils Absolute: 28 cells/uL (ref 0–200)
Eosinophils Absolute: 104 cells/uL (ref 15–500)
Eosinophils Relative: 1.5 %
HCT: 34.9 % — ABNORMAL LOW (ref 35.0–45.0)
HEMOGLOBIN: 12 g/dL (ref 11.7–15.5)
LYMPHS ABS: 2132 {cells}/uL (ref 850–3900)
MCH: 28.2 pg (ref 27.0–33.0)
MCHC: 34.4 g/dL (ref 32.0–36.0)
MCV: 82.1 fL (ref 80.0–100.0)
MPV: 9.6 fL (ref 7.5–12.5)
Monocytes Relative: 6.7 %
NEUTROS ABS: 4175 {cells}/uL (ref 1500–7800)
Neutrophils Relative %: 60.5 %
PLATELETS: 337 10*3/uL (ref 140–400)
RBC: 4.25 10*6/uL (ref 3.80–5.10)
RDW: 13.3 % (ref 11.0–15.0)
TOTAL LYMPHOCYTE: 30.9 %
WBC: 6.9 10*3/uL (ref 3.8–10.8)
WBCMIX: 462 {cells}/uL (ref 200–950)

## 2017-02-16 LAB — COMPLETE METABOLIC PANEL WITH GFR
AG RATIO: 1.3 (calc) (ref 1.0–2.5)
ALBUMIN MSPROF: 3.7 g/dL (ref 3.6–5.1)
ALT: 12 U/L (ref 6–29)
AST: 12 U/L (ref 10–35)
Alkaline phosphatase (APISO): 56 U/L (ref 33–130)
BUN: 8 mg/dL (ref 7–25)
CALCIUM: 9.3 mg/dL (ref 8.6–10.4)
CO2: 25 mmol/L (ref 20–32)
Chloride: 103 mmol/L (ref 98–110)
Creat: 0.59 mg/dL (ref 0.50–1.05)
GFR, EST NON AFRICAN AMERICAN: 105 mL/min/{1.73_m2} (ref >=60)
GFR, Est African American: 122 mL/min/{1.73_m2} (ref >=60)
GLUCOSE: 226 mg/dL — AB (ref 65–99)
Globulin: 2.9 g/dL (calc) (ref 1.9–3.7)
Potassium: 4.2 mmol/L (ref 3.5–5.3)
Sodium: 135 mmol/L (ref 135–146)
TOTAL PROTEIN: 6.6 g/dL (ref 6.1–8.1)
Total Bilirubin: 0.4 mg/dL (ref 0.2–1.2)

## 2017-02-16 LAB — MICROALBUMIN / CREATININE URINE RATIO
Creatinine, Urine: 111 mg/dL (ref 20–275)
MICROALB UR: 0.3 mg/dL
MICROALB/CREAT RATIO: 3 ug/mg{creat} (ref <30)

## 2017-02-16 LAB — LIPID PANEL
Cholesterol: 158 mg/dL (ref <200)
HDL: 56 mg/dL (ref >50)
LDL CHOLESTEROL (CALC): 84 mg/dL
NON-HDL CHOLESTEROL (CALC): 102 mg/dL (ref <130)
TRIGLYCERIDES: 85 mg/dL (ref <150)
Total CHOL/HDL Ratio: 2.8 (calc) (ref <5.0)

## 2017-02-16 LAB — URINALYSIS, ROUTINE W REFLEX MICROSCOPIC
BILIRUBIN URINE: NEGATIVE
GLUCOSE, UA: NEGATIVE
Hgb urine dipstick: NEGATIVE
KETONES UR: NEGATIVE
Leukocytes, UA: NEGATIVE
Nitrite: NEGATIVE
PROTEIN: NEGATIVE
SPECIFIC GRAVITY, URINE: 1.018 (ref 1.001–1.03)
pH: 6.5 (ref 5.0–8.0)

## 2017-02-16 LAB — HEMOGLOBIN A1C
EAG (MMOL/L): 10.9 (calc)
HEMOGLOBIN A1C: 8.5 %{Hb} — AB (ref <5.7)
Mean Plasma Glucose: 197 (calc)

## 2017-02-16 LAB — TSH: TSH: 2.86 mIU/L

## 2017-02-17 ENCOUNTER — Ambulatory Visit: Payer: Medicare HMO | Admitting: Internal Medicine

## 2017-02-21 ENCOUNTER — Other Ambulatory Visit: Payer: Self-pay

## 2017-02-21 MED ORDER — METFORMIN HCL 1000 MG PO TABS: 1000.0000 mg | ORAL_TABLET | Freq: Two times a day (BID) | ORAL | 3 refills | Status: DC

## 2017-02-21 NOTE — Telephone Encounter (Signed)
Medication changes based on recent lab results. Medication list has been updated and Rx was sent to pharmacy.

## 2017-03-17 ENCOUNTER — Encounter: Payer: Self-pay | Admitting: Internal Medicine

## 2017-03-17 ENCOUNTER — Ambulatory Visit: Payer: Medicare HMO | Admitting: Internal Medicine

## 2017-03-17 VITALS — BP 132/70 | HR 90 | Temp 98.4°F | Resp 16 | Ht 63.0 in | Wt 229.6 lb

## 2017-03-17 DIAGNOSIS — Z23 Encounter for immunization: Secondary | ICD-10-CM

## 2017-03-17 DIAGNOSIS — M545 Low back pain: Secondary | ICD-10-CM | POA: Diagnosis not present

## 2017-03-17 DIAGNOSIS — F331 Major depressive disorder, recurrent, moderate: Secondary | ICD-10-CM

## 2017-03-17 DIAGNOSIS — F411 Generalized anxiety disorder: Secondary | ICD-10-CM

## 2017-03-17 DIAGNOSIS — G8929 Other chronic pain: Secondary | ICD-10-CM | POA: Diagnosis not present

## 2017-03-17 DIAGNOSIS — E1140 Type 2 diabetes mellitus with diabetic neuropathy, unspecified: Secondary | ICD-10-CM | POA: Diagnosis not present

## 2017-03-17 DIAGNOSIS — I10 Essential (primary) hypertension: Secondary | ICD-10-CM

## 2017-03-17 MED ORDER — LISINOPRIL 20 MG PO TABS: 20.0000 mg | ORAL_TABLET | Freq: Every day | ORAL | 3 refills | Status: DC

## 2017-03-17 MED ORDER — DULOXETINE HCL 60 MG PO CPEP: 60.0000 mg | ORAL_CAPSULE | Freq: Every day | ORAL | 6 refills | Status: AC

## 2017-03-17 MED ORDER — METFORMIN HCL 1000 MG PO TABS: 1000.0000 mg | ORAL_TABLET | Freq: Two times a day (BID) | ORAL | 3 refills | Status: AC

## 2017-03-17 NOTE — Progress Notes (Signed)
Patient ID: Lauren Mccormick, female   DOB: [birth year], 51 y.o.   MRN: [id number]    Location:  [facility] Place of Service: OFFICE  Chief Complaint  Patient presents with  . Medical Management of Chronic Issues    Follow up from physical last month. No acute concerns at this time.   . Medication Refill    Lisinopril and Metformin( pending orders)  . Results    Discuss labs.  . Immunizations    Patient would like the flu shot today.     HPI:  51 yo female seen today for f/u. She has been noncompliant with meds overall due to increased depression. Back pain is worse.  Dyspnea - She has intermittent SOB with associated weakness x 6-33yrs. Recent CT chest without contrast revealed 7mm ground glass nodule but no other parenchymal changes and no other changes in chest. Nonsmoker. occasional dizziness with SOB. No HA. BP improved today on lisinopril hct. 2D echo in 2015 revealed LA mildly dilated; EF 55-60%. She saw cardio Dr Elease Hashimoto in 2015 but prefers to see a different provider if she needs to be referred again.CXR in 2015 was neg for acute process. 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 She did wear a mask most times. No hemoptysis, night sweats, weight loss. She notes dry cough and occasional chest pain.  DM - uncontrolled. A1c 8.5%. she does not check BS at home as she does not have a glucometer (she lost hers). No low BS reactions. She takes metformin 1000mg  BID and januvia. No numbness/tingling in feet/hands. Last eye exam in 07/2013. She does not have vision insurance yet. Her vision is reduced and blurry. LDL 84; HDL 56. She has lost 7 lbs since last OV. Urine micro/Cr ratio 4  Depression - uncontrolled. she stopped cymbalta and resumed seroquel (which was stopped several mos ago due to sedation) to help her sleep. Followed by psych Dr Donell Beers with [facility] but has not had an appt in at  least 6 mos due to transportation issues. She has intermittent paranoia. No hallucinations. She has MDD (recurrent, severe), GAD and social anxiety d/o.   HTN - BP stable on lisinopril hct and atenolol. She takes ASA 81mg  daily  Tachycardia - rate controlled on atenolol. she has frequent palpitations and reports past hx afib.  Chronic LBP - she has right leg weakness that prevents her walking long distances without stopping to rest. No recent falls. She has low back pain. Occasional numbness in legs/feet. No loss of bowel/bladder control  Past Medical History:  Diagnosis Date  . Anemia   . Depression   . Diabetes mellitus without complication (HCC)   . Diabetes mellitus, type II (HCC)   . Hypertension   . Tachycardia 2012   Periodic problems with tachycardia with undetermined cause    Past Surgical History:  Procedure Laterality Date  . bean in ears     childhood    Patient Care Team: Kirt Boys, DO as PCP - General (Internal Medicine)  Social History   Socioeconomic History  . Marital status: Single    Spouse name: Not on file  . Number of children: Not on file  . Years of education: Not on file  . Highest education level: Not on file  Social Needs  . Financial resource strain: Not on file  . Food insecurity - worry: Not on file  .  Food insecurity - inability: Not on file  . Transportation needs - medical: Not on file  . Transportation needs - non-medical: Not on file  Occupational History  . Not on file  Tobacco Use  . Smoking status: Never Smoker  . Smokeless tobacco: Never Used  Substance and Sexual Activity  . Alcohol use: No    Alcohol/week: 0.0 oz  . Drug use: No  . Sexual activity: Not Currently  Other Topics Concern  . Not on file  Social History Narrative   Diet:    Do you drink/eat things with caffeine? Yes   Marital status: No                              What year were you married?    Do you live in a house, apartment, assisted living, condo,  trailer, etc)? Apartment   Is it one or more stories?    How many persons live in your home?    Do you have any pets in your home? Dog   Current or past profession:    Do you exercise?    No                                                Type & how often:   Do you have a living will? No   Do you have a DNR Form?  No                                                                                                                   Do you have a POA/HPOA forms? No     reports that  has never smoked. she has never used smokeless tobacco. She reports that she does not drink alcohol or use drugs.  Family History  Problem Relation Age of Onset  . Hypertension Mother   . Heart disease Father   . Hypertension Father   . Stroke Father   . Alcohol abuse Father   . Diabetes Sister   . Anxiety disorder Brother   . Depression Brother   . Drug abuse Cousin   . Colon polyps Neg Hx    Family Status  Relation Name Status  . Mother Ruby Deceased  . Father Molly Maduro Deceased  . Sister [employer]  . Brother [employer]  . Cousin  (Not Specified)  . Neg Hx  (Not Specified)     No Known Allergies  Medications:   Medication List        Accurate as of 03/17/17  9:16 AM. Always use your most recent med list.          aspirin 81 MG chewable tablet Chew 1 tablet (81 mg total) by mouth daily. For heart health   atenolol 50 MG tablet Commonly known as:  TENORMIN Take 1  tablet (50 mg total) by mouth daily.   cetirizine 10 MG tablet Commonly known as:  ZYRTEC Take 1 tablet (10 mg total) by mouth daily. For seasonal allergy and itching   DULoxetine 60 MG capsule Commonly known as:  CYMBALTA Take 1 capsule (60 mg total) by mouth daily.   lisinopril 20 MG tablet Commonly known as:  PRINIVIL,ZESTRIL Take 1 tablet (20 mg total) by mouth daily. For high blood pressure   lisinopril-hydrochlorothiazide 20-12.5 MG tablet Commonly known as:  ZESTORETIC Take 1 tablet by mouth daily.     metFORMIN 1000 MG tablet Commonly known as:  GLUCOPHAGE Take 1 tablet (1,000 mg total) by mouth 2 (two) times daily with a meal.   multivitamin with minerals Tabs tablet Take 1 tablet by mouth daily. For low vitamin   sitaGLIPtin 100 MG tablet Commonly known as:  JANUVIA Take 1 tablet (100 mg total) by mouth daily. For diabetes       Review of Systems  Vitals:   03/17/17 0853  BP: 132/70  Pulse: 90  Resp: 16  Temp: 98.4 F (36.9 C)  TempSrc: Oral  SpO2: 98%  Weight: 229 lb 9.6 oz (104.1 kg)  Height: 5\' 3"  (1.6 m)   Body mass index is 40.67 kg/m.  Physical Exam  Constitutional: She is oriented to person, place, and time. She appears well-developed and well-nourished.  Looks well in NAD  HENT:  Mouth/Throat: Oropharynx is clear and moist. No oropharyngeal exudate.  MMM; no oral thrush  Eyes: Pupils are equal, round, and reactive to light. No scleral icterus.  Neck: Neck supple. Carotid bruit is not present. No tracheal deviation present. No thyromegaly present.  Cardiovascular: Normal rate, regular rhythm and intact distal pulses. Exam reveals no gallop and no friction rub.  Murmur (1/6 SEM) heard. No LE edema b/l. no calf TTP.   Pulmonary/Chest: Effort normal and breath sounds normal. No stridor. No respiratory distress. She has no wheezes. She has no rales.  Abdominal: Soft. Normal appearance and bowel sounds are normal. She exhibits no distension and no mass. There is no hepatomegaly. There is no tenderness. There is no rigidity, no rebound and no guarding. No hernia.  obese  Musculoskeletal: She exhibits edema and tenderness.       Lumbar back: She exhibits decreased range of motion, tenderness, pain and spasm. She exhibits no deformity.       Back:  Lymphadenopathy:    She has no cervical adenopathy.  Neurological: She is alert and oriented to person, place, and time. Gait (antalgic) abnormal.  Skin: Skin is warm and dry. No rash noted.  Psychiatric: Her  speech is normal and behavior is normal. Judgment normal. She is not actively hallucinating. Thought content is not paranoid and not delusional. Cognition and memory are normal. She exhibits a depressed mood.   Diabetic Foot Exam - Simple   Simple Foot Form Visual Inspection No deformities, no ulcerations, no other skin breakdown bilaterally:  Yes Sensation Testing See comments:  Yes Pulse Check Posterior Tibialis and Dorsalis pulse intact bilaterally:  Yes Comments Reduced monofilament testing L>RLE. No calluses or ulcerations.      Labs reviewed: Appointment on 02/15/2017  Component Date Value Ref Range Status  . WBC 02/15/2017 6.9  3.8 - 10.8 Thousand/uL Final  . RBC 02/15/2017 4.25  3.80 - 5.10 Million/uL Final  . Hemoglobin 02/15/2017 12.0  11.7 - 15.5 g/dL Final  . HCT 16/02/9603 34.9* 35.0 - 45.0 % Final  . MCV 02/15/2017 82.1  80.0 - 100.0 fL Final  . MCH 02/15/2017 28.2  27.0 - 33.0 pg Final  . MCHC 02/15/2017 34.4  32.0 - 36.0 g/dL Final  . RDW 16/02/9603 13.3  11.0 - 15.0 % Final  . Platelets 02/15/2017 337  140 - 400 Thousand/uL Final  . MPV 02/15/2017 9.6  7.5 - 12.5 fL Final  . Neutro Abs 02/15/2017 4,175  1,500 - 7,800 cells/uL Final  . Lymphs Abs 02/15/2017 2,132  850 - 3,900 cells/uL Final  . WBC mixed population 02/15/2017 462  200 - 950 cells/uL Final  . Eosinophils Absolute 02/15/2017 104  15 - 500 cells/uL Final  . Basophils Absolute 02/15/2017 28  0 - 200 cells/uL Final  . Neutrophils Relative % 02/15/2017 60.5  % Final  . Total Lymphocyte 02/15/2017 30.9  % Final  . Monocytes Relative 02/15/2017 6.7  % Final  . Eosinophils Relative 02/15/2017 1.5  % Final  . Basophils Relative 02/15/2017 0.4  % Final  . Glucose, Bld 02/15/2017 226* 65 - 99 mg/dL Final   Comment: .            Fasting reference interval . For someone without known diabetes, a glucose value >125 mg/dL indicates that they may have diabetes and this should be confirmed with a follow-up  test. .   . BUN 02/15/2017 8  7 - 25 mg/dL Final  . Creat 54/01/8118 0.59  0.50 - 1.05 mg/dL Final   Comment: For patients >60 years of age, the reference limit for Creatinine is approximately 13% higher for people identified as African-American. .   . GFR, Est Non African American 02/15/2017 105  > OR = 60 mL/min/1.28m2 Final  . GFR, Est African American 02/15/2017 122  > OR = 60 mL/min/1.61m2 Final  . BUN/Creatinine Ratio 02/15/2017 NOT APPLICABLE  6 - 22 (calc) Final  . Sodium 02/15/2017 135  135 - 146 mmol/L Final  . Potassium 02/15/2017 4.2  3.5 - 5.3 mmol/L Final  . Chloride 02/15/2017 103  98 - 110 mmol/L Final  . CO2 02/15/2017 25  20 - 32 mmol/L Final  . Calcium 02/15/2017 9.3  8.6 - 10.4 mg/dL Final  . Total Protein 02/15/2017 6.6  6.1 - 8.1 g/dL Final  . Albumin 14/78/2956 3.7  3.6 - 5.1 g/dL Final  . Globulin 21/30/8657 2.9  1.9 - 3.7 g/dL (calc) Final  . AG Ratio 02/15/2017 1.3  1.0 - 2.5 (calc) Final  . Total Bilirubin 02/15/2017 0.4  0.2 - 1.2 mg/dL Final  . Alkaline phosphatase (APISO) 02/15/2017 56  33 - 130 U/L Final  . AST 02/15/2017 12  10 - 35 U/L Final  . ALT 02/15/2017 12  6 - 29 U/L Final  . Cholesterol 02/15/2017 158  <200 mg/dL Final  . HDL 84/69/6295 56  >50 mg/dL Final  . Triglycerides 02/15/2017 85  <150 mg/dL Final  . LDL Cholesterol (Calc) 02/15/2017 84  mg/dL (calc) Final   Comment: Reference range: <100 . Desirable range <100 mg/dL for primary prevention;   <70 mg/dL for patients with CHD or diabetic patients  with > or = 2 CHD risk factors. Marland Kitchen LDL-C is now calculated using the Martin-Hopkins  calculation, which is a validated novel method providing  better accuracy than the Friedewald equation in the  estimation of LDL-C.  Horald Pollen et al. Lenox Ahr. 2841;324(40): 2061-2068  (http://education.[facility].com/faq/FAQ164)   . Total CHOL/HDL Ratio 02/15/2017 2.8  <1.0 (calc) Final  . Non-HDL Cholesterol (Calc) 02/15/2017 102  <130 mg/dL (calc)  Final   Comment: For patients with diabetes plus 1 major ASCVD risk  factor, treating to a non-HDL-C goal of <100 mg/dL  (LDL-C of <16 mg/dL) is considered a therapeutic  option.   . Creatinine, Urine 02/15/2017 111  20 - 275 mg/dL Final  . Microalb, Ur 10/96/0454 0.3  mg/dL Final   Comment: Reference Range Not established   . Microalb Creat Ratio 02/15/2017 3  <30 mcg/mg creat Final   Comment: . The ADA defines abnormalities in albumin excretion as follows: Marland Kitchen Category         Result (mcg/mg creatinine) . Normal                    <30 Microalbuminuria         30-299  Clinical albuminuria   > OR = 300 . The ADA recommends that at least two of three specimens collected within a 3-6 month period be abnormal before considering a patient to be within a diagnostic category.   Marland Kitchen TSH 02/15/2017 2.86  mIU/L Final   Comment:           Reference Range .           > or = 20 Years  0.40-4.50 .                Pregnancy Ranges           First trimester    0.26-2.66           Second trimester   0.55-2.73           Third trimester    0.43-2.91   . Hgb A1c MFr Bld 02/15/2017 8.5* <5.7 % of total Hgb Final   Comment: For someone without known diabetes, a hemoglobin A1c value of 6.5% or greater indicates that they may have  diabetes and this should be confirmed with a follow-up  test. . For someone with known diabetes, a value <7% indicates  that their diabetes is well controlled and a value  greater than or equal to 7% indicates suboptimal  control. A1c targets should be individualized based on  duration of diabetes, age, comorbid conditions, and  other considerations. . Currently, no consensus exists regarding use of hemoglobin A1c for diagnosis of diabetes for children. .   . Mean Plasma Glucose 02/15/2017 197  (calc) Final  . eAG (mmol/L) 02/15/2017 10.9  (calc) Final  . Color, Urine 02/15/2017 YELLOW  YELLOW Final  . APPearance 02/15/2017 CLEAR  CLEAR Final  . Specific  Gravity, Urine 02/15/2017 1.018  1.001 - 1.03 Final  . pH 02/15/2017 6.5  5.0 - 8.0 Final  . Glucose, UA 02/15/2017 NEGATIVE  NEGATIVE Final  . Bilirubin Urine 02/15/2017 NEGATIVE  NEGATIVE Final  . Ketones, ur 02/15/2017 NEGATIVE  NEGATIVE Final  . Hgb urine dipstick 02/15/2017 NEGATIVE  NEGATIVE Final  . Protein, ur 02/15/2017 NEGATIVE  NEGATIVE Final  . Nitrite 02/15/2017 NEGATIVE  NEGATIVE Final  . Leukocytes, UA 02/15/2017 NEGATIVE  NEGATIVE Final    No results found.   Assessment/Plan   ICD-10-CM   1. Major depressive disorder, recurrent episode, moderate (HCC) - uncontrolled F33.1 DULoxetine (CYMBALTA) 60 MG capsule  2. GAD (generalized anxiety disorder) F41.1 DULoxetine (CYMBALTA) 60 MG capsule  3. Type 2 diabetes mellitus with diabetic neuropathy, without long-term current use of insulin (HCC) E11.40 metFORMIN (GLUCOPHAGE) 1000 MG tablet  4. Essential hypertension, benign I10 lisinopril (PRINIVIL,ZESTRIL) 20 MG tablet  5. Chronic right-sided low back pain without sciatica  M54.5    G89.29   6. Need for immunization against influenza Z23 Flu Vaccine QUAD 36+ mos IM    SCAT form completed and faxed to [facility]  Watch complex carbs  RESUME CYMBALTA 60MG  DAILY  MAKE APPT TO SEE DR PLOVSKY FOR DEPRESSION  Influenza vaccine given today  Continue other medications as ordered  Follow up in 1 mo for depression  Chenel Wernli S. Ancil Linsey  [facility] [address] [place], [place] [address] [phone] Cell (Monday-Friday 8 AM - 5 PM) [phone] After 5 PM and follow prompts

## 2017-03-17 NOTE — Patient Instructions (Signed)
RESUME CYMBALTA 60MG  DAILY  MAKE APPT TO SEE DR PLOVSKY FOR DEPRESSION  Influenza vaccine given today  Continue other medications as ordered  Follow up in 1 mo for depression

## 2017-04-18 ENCOUNTER — Ambulatory Visit: Payer: Medicare HMO | Admitting: Internal Medicine

## 2017-05-19 ENCOUNTER — Ambulatory Visit (INDEPENDENT_AMBULATORY_CARE_PROVIDER_SITE_OTHER): Payer: Medicare HMO | Admitting: Internal Medicine

## 2017-05-19 ENCOUNTER — Encounter: Payer: Self-pay | Admitting: Internal Medicine

## 2017-05-19 ENCOUNTER — Ambulatory Visit: Payer: Medicare HMO | Admitting: Internal Medicine

## 2017-05-19 VITALS — BP 128/68 | HR 89 | Temp 98.2°F | Resp 18 | Ht 63.0 in | Wt 225.2 lb

## 2017-05-19 DIAGNOSIS — F411 Generalized anxiety disorder: Secondary | ICD-10-CM | POA: Diagnosis not present

## 2017-05-19 DIAGNOSIS — F331 Major depressive disorder, recurrent, moderate: Secondary | ICD-10-CM

## 2017-05-19 DIAGNOSIS — L299 Pruritus, unspecified: Secondary | ICD-10-CM | POA: Diagnosis not present

## 2017-05-19 MED ORDER — BUPROPION HCL ER (XL) 150 MG PO TB24: 150.0000 mg | ORAL_TABLET | Freq: Every day | ORAL | 6 refills | Status: AC

## 2017-05-19 NOTE — Progress Notes (Signed)
Patient ID: Lauren Mccormick, female   DOB: [birth year], 52 y.o.   MRN: [id number]   Location:  [facility] OFFICE  Provider: DR Elmon Kirschner  Code Status: FULL CODE Goals of Care:  Advanced Directives 10/21/2016  Does Patient Have a Medical Advance Directive? No  Would patient like information on creating a medical advance directive? -  Some encounter information is confidential and restricted. Go to Review Flowsheets activity to see all data.     Chief Complaint  Patient presents with  . Medical Management of Chronic Issues    1 month follow up for depression. Patient stated that she has been having shortness of breath in the morning and some weakness.   . Medication Refill    No refills needed at this time     HPI: Patient is a 52 y.o. female seen today for f/u depression.  She was to resume cymbalta 60mg  daily and f/u with psych Dr Donell Beers after last ov. She has not seen psych due to transportation issues. SCAT denied as form not properly completed. She needs new form completed. Anxiety improved. She does forget to take cymbalta occasionally. She continues to have issues with itching. No relief with zyrtec but she continues to take it.  Dyspnea - unchanged. She has intermittent SOB with associated weakness x 6-22yrs. Recent CT chest without contrast revealed 7mm ground glass nodule but no other parenchymal changes and no other changes in chest. Nonsmoker. occasional dizziness with SOB. No HA. BP improved today on lisinopril hct. 2D echo in 2015 revealed LA mildly dilated; EF 55-60%. She saw cardio Dr Elease Hashimoto in 2015 but prefers to see a different provider if she needs to be referred again.CXR in 2015 was neg for acute process. 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 She did wear a mask most times. No hemoptysis, night sweats, weight loss. She notes dry cough and occasional chest pain.   Recurrent severe  MDD/GAD/social anxiety d/o - uncontrolled. No improvement on cymbalta. She no longer takes seroquel. Followed by psych Dr Donell Beers with [facility] but has not had an appt in > 6 mos due to transportation issues. She has intermittent paranoia. No hallucinations.   Tachycardia - stable on atenolol. she has frequent palpitations and reports past hx afib.  Chronic LBP - stable. she has right leg weakness that prevents her walking long distances without stopping to rest. No recent falls. She has low back pain. Occasional numbness in legs/feet. No loss of bowel/bladder control    Past Medical History:  Diagnosis Date  . Anemia   . Depression   . Diabetes mellitus without complication (HCC)   . Diabetes mellitus, type II (HCC)   . Hypertension   . Tachycardia 2012   Periodic problems with tachycardia with undetermined cause    Past Surgical History:  Procedure Laterality Date  . bean in ears     childhood     reports that  has never smoked. she has never used smokeless tobacco. She reports that she does not drink alcohol or use drugs. Social History   Socioeconomic History  . Marital status: Single    Spouse name: Not on file  . Number of children: Not on file  . Years of education: Not on file  . Highest education level: Not on file  Social Needs  . Financial resource strain: Not on file  .  Food insecurity - worry: Not on file  . Food insecurity - inability: Not on file  . Transportation needs - medical: Not on file  . Transportation needs - non-medical: Not on file  Occupational History  . Not on file  Tobacco Use  . Smoking status: Never Smoker  . Smokeless tobacco: Never Used  Substance and Sexual Activity  . Alcohol use: No    Alcohol/week: 0.0 oz  . Drug use: No  . Sexual activity: Not Currently  Other Topics Concern  . Not on file  Social History Narrative   Diet:    Do you drink/eat things with caffeine? Yes   Marital status: No                               What year were you married?    Do you live in a house, apartment, assisted living, condo, trailer, etc)? Apartment   Is it one or more stories?    How many persons live in your home?    Do you have any pets in your home? Dog   Current or past profession:    Do you exercise?    No                                                Type & how often:   Do you have a living will? No   Do you have a DNR Form?  No                                                                                                                   Do you have a POA/HPOA forms? No    Family History  Problem Relation Age of Onset  . Hypertension Mother   . Heart disease Father   . Hypertension Father   . Stroke Father   . Alcohol abuse Father   . Diabetes Sister   . Anxiety disorder Brother   . Depression Brother   . Drug abuse Cousin   . Colon polyps Neg Hx     No Known Allergies  Outpatient Encounter Medications as of 05/19/2017  Medication Sig  . aspirin 81 MG chewable tablet Chew 1 tablet (81 mg total) by mouth daily. For heart health  . atenolol (TENORMIN) 50 MG tablet Take 1 tablet (50 mg total) by mouth daily.  . cetirizine (ZYRTEC) 10 MG tablet Take 1 tablet (10 mg total) by mouth daily. For seasonal allergy and itching  . DULoxetine (CYMBALTA) 60 MG capsule Take 1 capsule (60 mg total) daily by mouth.  Marland Kitchen lisinopril (PRINIVIL,ZESTRIL) 20 MG tablet Take 1 tablet (20 mg total) daily by mouth. For high blood pressure  . lisinopril-hydrochlorothiazide (ZESTORETIC) 20-12.5 MG tablet Take 1 tablet by mouth daily.  . metFORMIN (GLUCOPHAGE) 1000 MG tablet Take 1  tablet (1,000 mg total) 2 (two) times daily with a meal by mouth.  . Multiple Vitamin (MULTIVITAMIN WITH MINERALS) TABS tablet Take 1 tablet by mouth daily. For low vitamin  . sitaGLIPtin (JANUVIA) 100 MG tablet Take 1 tablet (100 mg total) by mouth daily. For diabetes   No facility-administered encounter medications on file as of  05/19/2017.     Review of Systems:  Review of Systems  Respiratory: Positive for cough and shortness of breath.   Musculoskeletal: Positive for arthralgias.  Psychiatric/Behavioral: Positive for dysphoric mood. Negative for hallucinations. The patient is not nervous/anxious.   All other systems reviewed and are negative.   Health Maintenance  Topic Date Due  . FOOT EXAM  01/02/2016  . OPHTHALMOLOGY EXAM  03/16/2018 (Originally 05/16/1974)  . TETANUS/TDAP  03/17/2018 (Originally 05/17/1983)  . HIV Screening  03/17/2018 (Originally 05/17/1979)  . MAMMOGRAM  06/02/2017  . HEMOGLOBIN A1C  08/16/2017  . PAP SMEAR  02/19/2018  . PNEUMOCOCCAL POLYSACCHARIDE VACCINE (2) 10/07/2018  . COLONOSCOPY  06/03/2020  . INFLUENZA VACCINE  Completed    Physical Exam: Vitals:   05/19/17 0904  BP: 128/68  Pulse: 89  Resp: 18  Temp: 98.2 F (36.8 C)  TempSrc: Oral  SpO2: 97%  Weight: 225 lb 3.2 oz (102.2 kg)  Height: 5\' 3"  (1.6 m)   Body mass index is 39.89 kg/m. Physical Exam  Constitutional: She is oriented to person, place, and time. She appears well-developed and well-nourished.  HENT:  Mouth/Throat: Oropharynx is clear and moist. No oropharyngeal exudate.  MMM; no oral thrush  Eyes: Pupils are equal, round, and reactive to light. No scleral icterus.  Neck: Neck supple. Carotid bruit is not present. No tracheal deviation present. No thyromegaly present.  Cardiovascular: Normal rate, regular rhythm and intact distal pulses. Exam reveals no gallop and no friction rub.  Murmur (1/6 SEM) heard. No LE edema b/l. no calf TTP.   Pulmonary/Chest: Effort normal and breath sounds normal. No stridor. No respiratory distress. She has no wheezes. She has no rales.  Abdominal: Soft. Normal appearance and bowel sounds are normal. She exhibits no distension and no mass. There is no hepatomegaly. There is no tenderness. There is no rigidity, no rebound and no guarding. No hernia.  obese  Musculoskeletal:  She exhibits edema.  Lymphadenopathy:    She has no cervical adenopathy.  Neurological: She is alert and oriented to person, place, and time.  Skin: Skin is warm and dry. No rash noted.  Psychiatric: Her behavior is normal. Judgment and thought content normal. She exhibits a depressed mood (flat affect).    Labs reviewed: Basic Metabolic Panel: Recent Labs    10/12/16 1057 02/15/17 0907  NA 139 135  K 4.2 4.2  CL 102 103  CO2 27 25  GLUCOSE 157* 226*  BUN 6* 8  CREATININE 0.63 0.59  CALCIUM 8.9 9.3  TSH  --  2.86   Liver Function Tests: Recent Labs    10/12/16 1057 02/15/17 0907  AST  --  12  ALT 10 12  BILITOT  --  0.4  PROT  --  6.6   No results for input(s): LIPASE, AMYLASE in the last 8760 hours. No results for input(s): AMMONIA in the last 8760 hours. CBC: Recent Labs    02/15/17 0907  WBC 6.9  NEUTROABS 4,175  HGB 12.0  HCT 34.9*  MCV 82.1  PLT 337   Lipid Panel: Recent Labs    02/15/17 0907  CHOL 158  HDL  56  TRIG 85  CHOLHDL 2.8   Lab Results  Component Value Date   HGBA1C 8.5 (H) 02/15/2017    Procedures since last visit: No results found.  Assessment/Plan   ICD-10-CM   1. Major depressive disorder, recurrent episode, moderate (HCC) F33.1 buPROPion (WELLBUTRIN XL) 150 MG 24 hr tablet  2. GAD (generalized anxiety disorder) F41.1    improved  3. Itching - probably 2/2 #1 +/- #2 L29.9    START BUPROPION XL 150MG  DAILY for depression  Continue cymbalta daily  Continue other medications as ordered  RETURN TO OFFICE WITH NEW SCAT FORM - may mail form  Highly recommend you follow up with Dr Donell Beers for mental health management  Follow up in 1 month for depression, anxiety.   Lily Kernen S. Ancil Linsey  [facility] [address] [place], [place] [address] [phone] Cell (Monday-Friday 8 AM - 5 PM) [phone] After 5 PM and follow prompts

## 2017-05-19 NOTE — Patient Instructions (Addendum)
START BUPROPION XL 150MG  DAILY for depression  Continue cymbalta daily  Continue other medications as ordered  RETURN TO OFFICE WITH NEW SCAT FORM - may mail form  Highly recommend you follow up with Dr Casimiro Needle for mental health management  Follow up in 1 month for depression, anxiety.

## 2017-05-31 ENCOUNTER — Telehealth: Payer: Self-pay | Admitting: *Deleted

## 2017-05-31 NOTE — Telephone Encounter (Signed)
Received SCAT Form from patient through mail with note included stating: "Mrs Lauren Mccormick this is Lauren Mccormick. I call the SCAT Bus about the application and she told me it was some page miss from the application. It was Part A and Part B. So we need to filled out what page was miss. And fax it back she said it not to late. They just need it back." Placed in Dr. Vale Haven folder to review, fill out and sign. Attached last OV note and Face Sheet.

## 2017-06-09 IMAGING — CR DG LUMBAR SPINE COMPLETE 4+V
5 series · 5 of 5 positions shown · non-contrast
Comparison: None.

CLINICAL DATA: Chronic low back pain for years.  Right-sided pain.

EXAM:
LUMBAR SPINE - COMPLETE 4+ VIEW

[w lumbar spine ap]
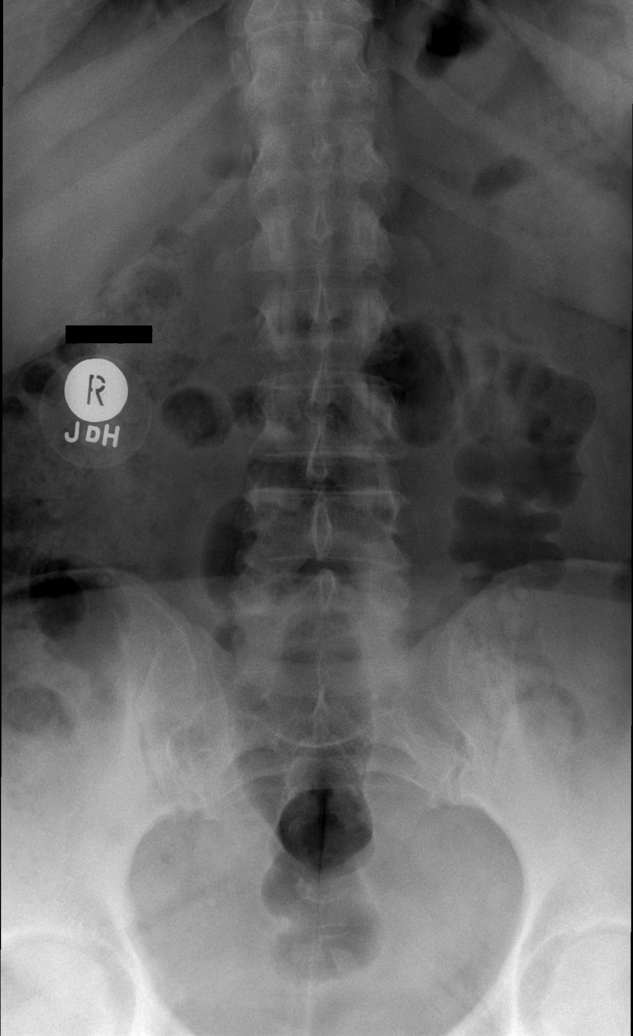

[w lumbar spine obl (1 of 2)]
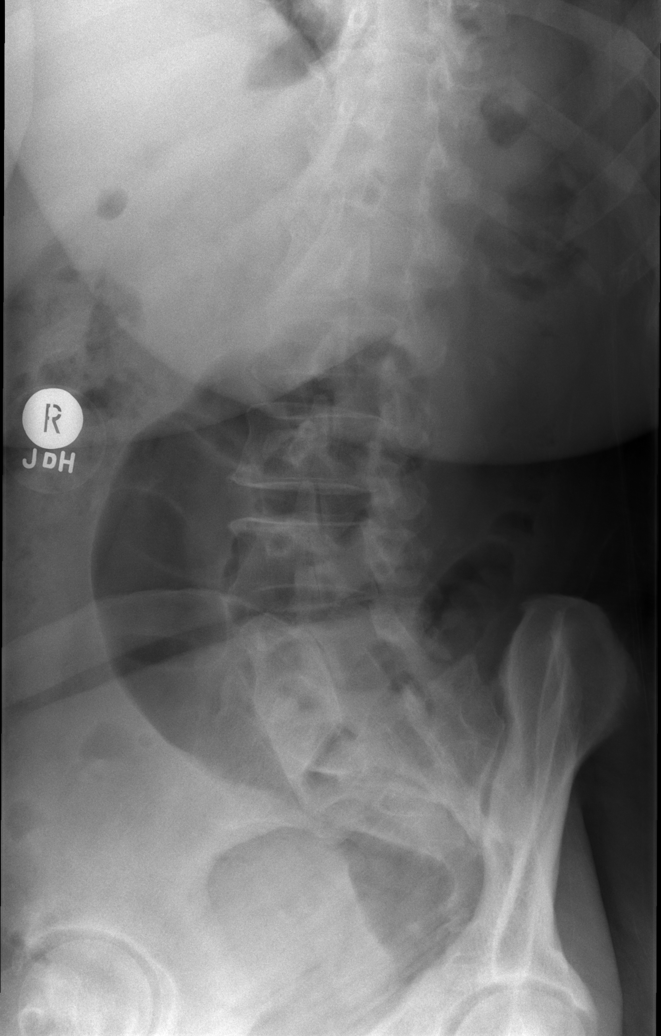

[w lumbar spine obl (2 of 2)]
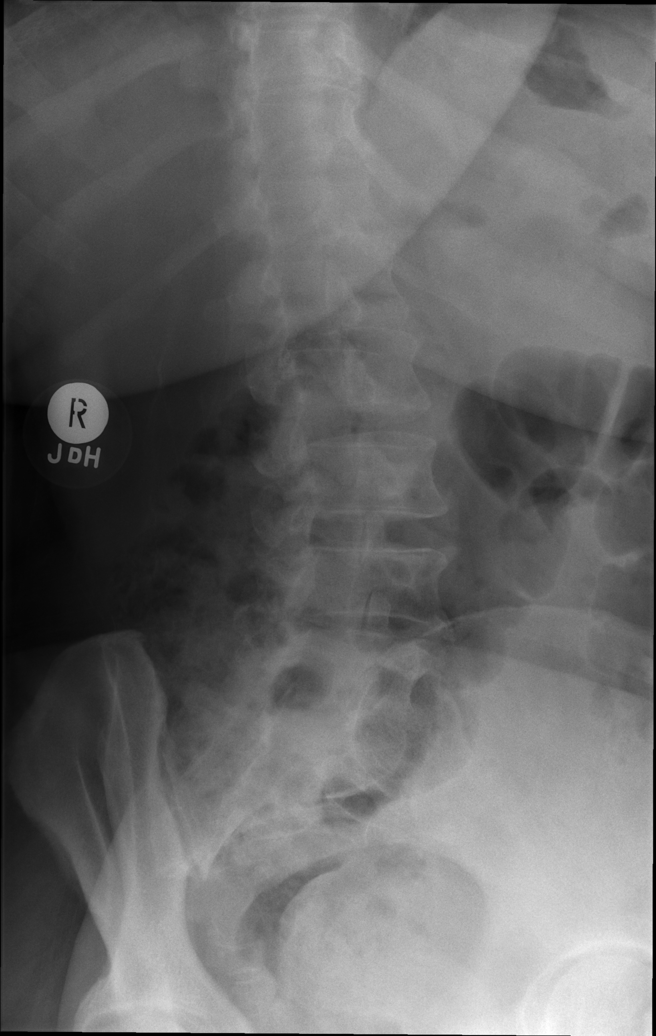

[w lumbar spine lat]
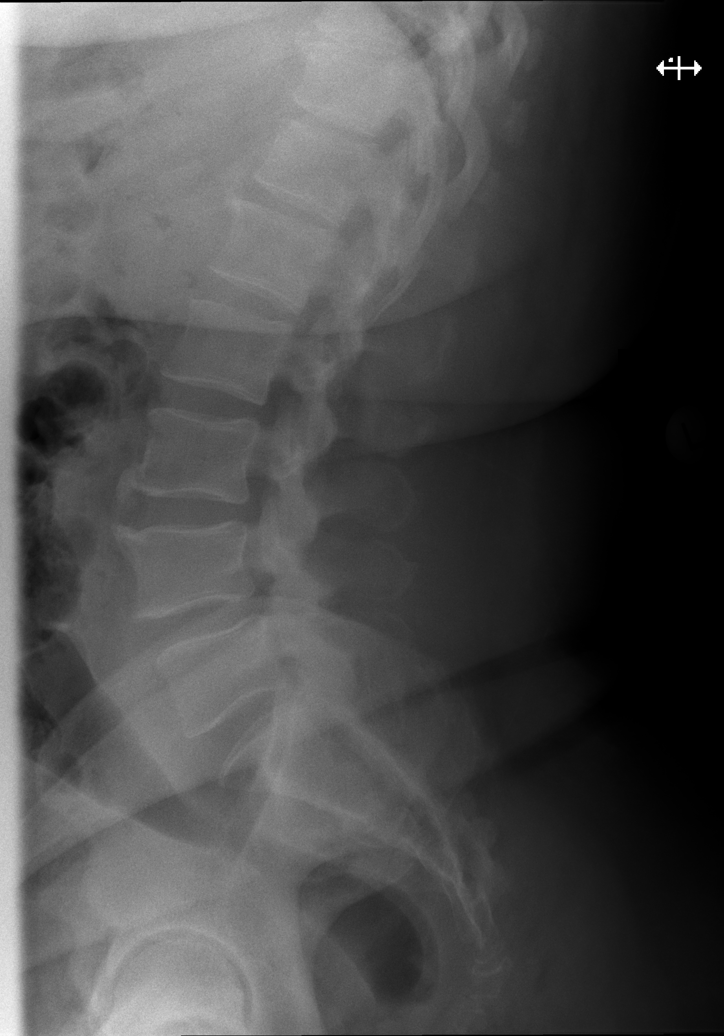

[w lumbar l-5 s-1 spot]
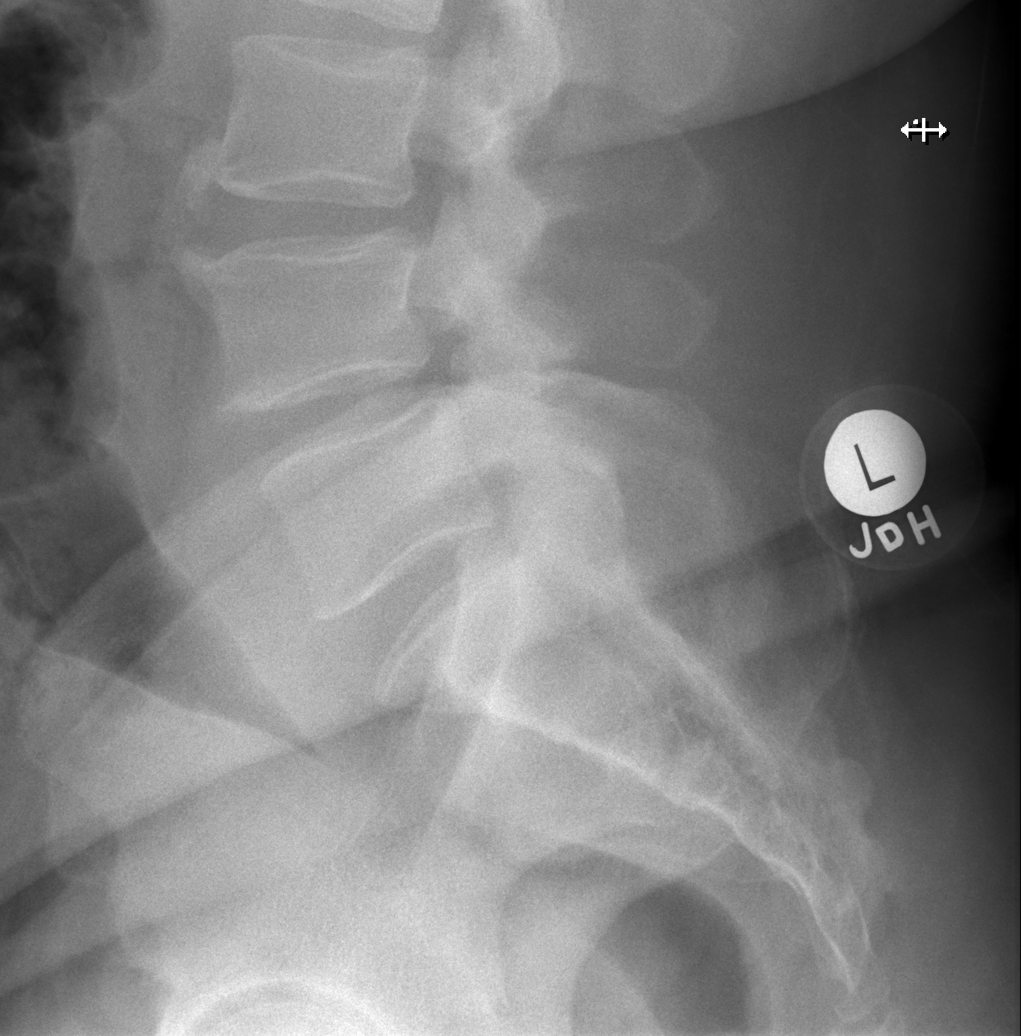

[5 of 5 positions shown; findings below may reference images not displayed]

FINDINGS: Degenerative spurring anteriorly in the mid to lower lumbar spine.
Normal alignment. Disc spaces are maintained. SI joints are
symmetric and unremarkable.
IMPRESSION: No acute bony abnormality.

## 2017-06-13 NOTE — Telephone Encounter (Signed)
Patient aware form completed, faxed and sent to scanning.  Patient did not wish to have a copy

## 2017-06-23 ENCOUNTER — Ambulatory Visit: Payer: Medicare HMO | Admitting: Internal Medicine

## 2017-07-04 ENCOUNTER — Encounter: Payer: Self-pay | Admitting: Internal Medicine

## 2017-09-11 ENCOUNTER — Telehealth: Payer: Self-pay | Admitting: Internal Medicine

## 2017-09-11 NOTE — Telephone Encounter (Signed)
I called the pt to schedule AWV w/ Lauren Mccormick and follow up appt w/ Dr. Eulas Post.  She said that she will call me back to schedule the appt. VDM (DD)

## 2017-10-11 DIAGNOSIS — E7800 Pure hypercholesterolemia, unspecified: Secondary | ICD-10-CM | POA: Diagnosis not present

## 2017-10-11 DIAGNOSIS — I10 Essential (primary) hypertension: Secondary | ICD-10-CM | POA: Diagnosis not present

## 2017-10-11 DIAGNOSIS — E559 Vitamin D deficiency, unspecified: Secondary | ICD-10-CM | POA: Diagnosis not present

## 2017-10-11 DIAGNOSIS — R5383 Other fatigue: Secondary | ICD-10-CM | POA: Diagnosis not present

## 2017-10-11 DIAGNOSIS — E1165 Type 2 diabetes mellitus with hyperglycemia: Secondary | ICD-10-CM | POA: Diagnosis not present

## 2017-10-11 DIAGNOSIS — Z79899 Other long term (current) drug therapy: Secondary | ICD-10-CM | POA: Diagnosis not present

## 2017-10-26 ENCOUNTER — Telehealth: Payer: Self-pay | Admitting: Internal Medicine

## 2017-10-26 NOTE — Telephone Encounter (Signed)
I left a message asking the patient to call me at [phone] to schedule AWV with Clarise Cruz and follow up visit w/ Dr. Eulas Post. VDM (DD)

## 2017-11-17 DIAGNOSIS — E1165 Type 2 diabetes mellitus with hyperglycemia: Secondary | ICD-10-CM | POA: Diagnosis not present

## 2017-11-17 DIAGNOSIS — I10 Essential (primary) hypertension: Secondary | ICD-10-CM | POA: Diagnosis not present

## 2017-11-17 DIAGNOSIS — E7800 Pure hypercholesterolemia, unspecified: Secondary | ICD-10-CM | POA: Diagnosis not present

## 2017-11-17 DIAGNOSIS — E559 Vitamin D deficiency, unspecified: Secondary | ICD-10-CM | POA: Diagnosis not present

## 2017-11-17 DIAGNOSIS — Z79899 Other long term (current) drug therapy: Secondary | ICD-10-CM | POA: Diagnosis not present

## 2017-12-27 ENCOUNTER — Encounter: Payer: Self-pay | Admitting: Internal Medicine

## 2018-01-25 DIAGNOSIS — I10 Essential (primary) hypertension: Secondary | ICD-10-CM | POA: Diagnosis not present

## 2018-01-25 DIAGNOSIS — R5383 Other fatigue: Secondary | ICD-10-CM | POA: Diagnosis not present

## 2018-01-25 DIAGNOSIS — E1165 Type 2 diabetes mellitus with hyperglycemia: Secondary | ICD-10-CM | POA: Diagnosis not present

## 2018-01-25 DIAGNOSIS — E7800 Pure hypercholesterolemia, unspecified: Secondary | ICD-10-CM | POA: Diagnosis not present

## 2018-01-25 DIAGNOSIS — E559 Vitamin D deficiency, unspecified: Secondary | ICD-10-CM | POA: Diagnosis not present

## 2018-02-14 ENCOUNTER — Telehealth: Payer: Self-pay

## 2018-02-14 NOTE — Telephone Encounter (Signed)
Called patient to try to schedule AWV in the office. No answer-left voicemail to call back.    

## 2018-05-15 DIAGNOSIS — E1165 Type 2 diabetes mellitus with hyperglycemia: Secondary | ICD-10-CM | POA: Diagnosis not present

## 2018-05-15 DIAGNOSIS — E559 Vitamin D deficiency, unspecified: Secondary | ICD-10-CM | POA: Diagnosis not present

## 2018-05-15 DIAGNOSIS — I10 Essential (primary) hypertension: Secondary | ICD-10-CM | POA: Diagnosis not present

## 2018-05-15 DIAGNOSIS — J018 Other acute sinusitis: Secondary | ICD-10-CM | POA: Diagnosis not present

## 2018-05-15 DIAGNOSIS — E049 Nontoxic goiter, unspecified: Secondary | ICD-10-CM | POA: Diagnosis not present

## 2018-05-15 DIAGNOSIS — E7800 Pure hypercholesterolemia, unspecified: Secondary | ICD-10-CM | POA: Diagnosis not present

## 2018-11-27 ENCOUNTER — Other Ambulatory Visit: Payer: Self-pay | Admitting: Family Medicine

## 2018-11-27 DIAGNOSIS — Z1231 Encounter for screening mammogram for malignant neoplasm of breast: Secondary | ICD-10-CM

## 2019-04-24 ENCOUNTER — Other Ambulatory Visit: Payer: Self-pay | Admitting: Family Medicine

## 2019-04-24 DIAGNOSIS — N644 Mastodynia: Secondary | ICD-10-CM

## 2019-05-13 ENCOUNTER — Other Ambulatory Visit: Payer: Medicare HMO

## 2019-05-24 ENCOUNTER — Ambulatory Visit
Admission: RE | Admit: 2019-05-24 | Discharge: 2019-05-24 | Disposition: A | Discharge status: Home / Self Care | Payer: Medicare HMO | Source: Ambulatory Visit | Attending: Family Medicine | Admitting: Family Medicine

## 2019-05-24 ENCOUNTER — Other Ambulatory Visit: Payer: Self-pay

## 2019-05-24 DIAGNOSIS — N644 Mastodynia: Secondary | ICD-10-CM

## 2020-11-30 ENCOUNTER — Encounter: Payer: Self-pay | Admitting: Internal Medicine

## 2022-04-29 ENCOUNTER — Other Ambulatory Visit: Payer: Self-pay | Admitting: Family Medicine

## 2022-04-29 DIAGNOSIS — R911 Solitary pulmonary nodule: Secondary | ICD-10-CM | POA: Diagnosis not present

## 2022-04-29 DIAGNOSIS — L659 Nonscarring hair loss, unspecified: Secondary | ICD-10-CM | POA: Diagnosis not present

## 2022-04-29 DIAGNOSIS — I10 Essential (primary) hypertension: Secondary | ICD-10-CM | POA: Diagnosis not present

## 2022-04-29 DIAGNOSIS — R0609 Other forms of dyspnea: Secondary | ICD-10-CM | POA: Diagnosis not present

## 2022-04-29 DIAGNOSIS — E119 Type 2 diabetes mellitus without complications: Secondary | ICD-10-CM | POA: Diagnosis not present

## 2022-06-03 ENCOUNTER — Ambulatory Visit (HOSPITAL_COMMUNITY)
Admission: RE | Admit: 2022-06-03 | Discharge: 2022-06-03 | Disposition: A | Discharge status: Home / Self Care | Payer: 59 | Source: Ambulatory Visit | Attending: Vascular Surgery | Admitting: Vascular Surgery

## 2022-06-03 ENCOUNTER — Other Ambulatory Visit (HOSPITAL_COMMUNITY): Payer: Self-pay | Admitting: Family Medicine

## 2022-06-03 DIAGNOSIS — I10 Essential (primary) hypertension: Secondary | ICD-10-CM | POA: Diagnosis not present

## 2022-06-03 DIAGNOSIS — M7989 Other specified soft tissue disorders: Secondary | ICD-10-CM | POA: Diagnosis not present

## 2022-06-15 ENCOUNTER — Encounter: Payer: Self-pay | Admitting: Cardiology

## 2022-06-15 NOTE — Progress Notes (Deleted)
Cardiology Office Note   Date:  06/15/2022   ID:  WRENLY LAURITSEN, DOB 1966/01/05, MRN [id number]  PCP:  Gildardo Cranker, DO  Cardiologist:   None Referring:  Gildardo Cranker, DO  No chief complaint on file.     History of Present Illness: Lauren Mccormick is a 57 y.o. female who presents for is referred by Gildardo Cranker, DO for evaluation of palpitations.  ***      Past Medical History:  Diagnosis Date   Anemia    Depression    Diabetes mellitus without complication ([place])    Diabetes mellitus, type II ([place])    Hypertension    Tachycardia 2012   Periodic problems with tachycardia with undetermined cause    Past Surgical History:  Procedure Laterality Date   bean in ears     childhood     Current Outpatient Medications  Medication Sig Dispense Refill   aspirin 81 MG chewable tablet Chew 1 tablet (81 mg total) by mouth daily. For heart health     atenolol (TENORMIN) 50 MG tablet Take 1 tablet (50 mg total) by mouth daily. 90 tablet 0   buPROPion (WELLBUTRIN XL) 150 MG 24 hr tablet Take 1 tablet (150 mg total) by mouth daily. For depression 30 tablet 6   cetirizine (ZYRTEC) 10 MG tablet Take 1 tablet (10 mg total) by mouth daily. For seasonal allergy and itching 30 tablet 11   DULoxetine (CYMBALTA) 60 MG capsule Take 1 capsule (60 mg total) daily by mouth. 30 capsule 6   lisinopril (PRINIVIL,ZESTRIL) 20 MG tablet Take 1 tablet (20 mg total) daily by mouth. For high blood pressure 30 tablet 3   lisinopril-hydrochlorothiazide (ZESTORETIC) 20-12.5 MG tablet Take 1 tablet by mouth daily. 90 tablet 1   metFORMIN (GLUCOPHAGE) 1000 MG tablet Take 1 tablet (1,000 mg total) 2 (two) times daily with a meal by mouth. 60 tablet 3   Multiple Vitamin (MULTIVITAMIN WITH MINERALS) TABS tablet Take 1 tablet by mouth daily. For low vitamin     sitaGLIPtin (JANUVIA) 100 MG tablet Take 1 tablet (100 mg total) by mouth daily. For diabetes 90 tablet 1   No current facility-administered  medications for this visit.    Allergies:   Patient has no known allergies.    Social History:  The patient  reports that she has never smoked. She has never used smokeless tobacco. She reports that she does not drink alcohol and does not use drugs.   Family History:  The patient's ***family history includes Alcohol abuse in her father; Anxiety disorder in her brother; Breast cancer in her cousin; Depression in her brother; Diabetes in her sister; Drug abuse in her cousin; Heart disease in her father; Hypertension in her father and mother; Stroke in her father.    ROS:  Please see the history of present illness.   Otherwise, review of systems are positive for {NONE DEFAULTED:18576}.   All other systems are reviewed and negative.    PHYSICAL EXAM: VS:  There were no vitals taken for this visit. , BMI There is no height or weight on file to calculate BMI. GENERAL:  Well appearing HEENT:  Pupils equal round and reactive, fundi not visualized, oral mucosa unremarkable NECK:  No jugular venous distention, waveform within normal limits, carotid upstroke brisk and symmetric, no bruits, no thyromegaly LYMPHATICS:  No cervical, inguinal adenopathy LUNGS:  Clear to auscultation bilaterally BACK:  No CVA tenderness CHEST:  Unremarkable HEART:  PMI not displaced or  sustained,S1 and S2 within normal limits, no S3, no S4, no clicks, no rubs, *** murmurs ABD:  Flat, positive bowel sounds normal in frequency in pitch, no bruits, no rebound, no guarding, no midline pulsatile mass, no hepatomegaly, no splenomegaly EXT:  2 plus pulses throughout, no edema, no cyanosis no clubbing SKIN:  No rashes no nodules NEURO:  Cranial nerves II through XII grossly intact, motor grossly intact throughout PSYCH:  Cognitively intact, oriented to person place and time    EKG:  EKG {ACTION; IS/IS RDE:[id number]} ordered today. The ekg ordered today demonstrates ***   Recent Labs: No results found for requested labs  within last 365 days.    Lipid Panel    Component Value Date/Time   CHOL 158 02/15/2017 0907   CHOL 154 09/28/2015 0953   TRIG 85 02/15/2017 0907   HDL 56 02/15/2017 0907   HDL 51 09/28/2015 0953   CHOLHDL 2.8 02/15/2017 0907   VLDL 19 03/23/2016 0901   LDLCALC 84 02/15/2017 0907      Wt Readings from Last 3 Encounters:  05/19/17 225 lb 3.2 oz (102.2 kg)  03/17/17 229 lb 9.6 oz (104.1 kg)  10/21/16 236 lb (107 kg)      Other studies Reviewed: Additional studies/ records that were reviewed today include: ***. Review of the above records demonstrates:  Please see elsewhere in the note.  ***   ASSESSMENT AND PLAN:  SOB:  ***  DM:  ***  HTN:  ***    Current medicines are reviewed at length with the patient today.  The patient {ACTIONS; HAS/DOES NOT HAVE:19233} concerns regarding medicines.  The following changes have been made:  {PLAN; NO CHANGE:13088:s}  Labs/ tests ordered today include: *** No orders of the defined types were placed in this encounter.    Disposition:   FU with ***    Signed, Minus Breeding, MD  06/15/2022 12:49 PM    [place]

## 2022-06-16 ENCOUNTER — Ambulatory Visit: Payer: 59 | Admitting: Cardiology

## 2022-06-17 DIAGNOSIS — I10 Essential (primary) hypertension: Secondary | ICD-10-CM | POA: Diagnosis not present

## 2022-06-23 NOTE — Progress Notes (Signed)
Cardiology Office Note   Date:  06/24/2022   ID:  Lauren Mccormick, DOB 03/30/1966, MRN [id number]  PCP:  Gildardo Cranker, DO  Cardiologist:   None Referring:  Gildardo Cranker, DO  Chief Complaint  Patient presents with   Shortness of Breath      History of Present Illness: Lauren Mccormick is a 57 y.o. female who presents for is referred by Gildardo Cranker, DO for evaluation of palpitations and SOB.  She was referred here because of that and some shortness of breath.  She did have an echocardiogram some years ago with well-preserved ejection fraction but mild LVH.  This sounds like she has had some difficult to control hypertension.  She had a pulmonary nodule and is overdue for follow-up CT.  She has been taking atenolol.  She has had diabetes not well-controlled and is now on metformin.  She has had a little lower extremity swelling and she blamed that on when she was taking carvedilol and neck up better off of that medication.  I see that she is also been on lisinopril in the past.  She is not sure what happened to that.  She is not having any PND or orthopnea.  She gets short of breath or walking a moderate distance or mild distance on level ground.  She is not describing chest pressure, neck or arm discomfort.  She did have venous Dopplers to look at her lower extremities because she has left greater than right lower extremity swelling and she had not no evidence of DVT.      Past Medical History:  Diagnosis Date   Anemia    Depression    Diabetes mellitus, type II ([place])    Hypertension    Tachycardia 05/09/2010   Periodic problems with tachycardia with undetermined cause    Past Surgical History:  Procedure Laterality Date   Bean in ears     childhood     Current Outpatient Medications  Medication Sig Dispense Refill   aspirin 81 MG chewable tablet Chew 1 tablet (81 mg total) by mouth daily. For heart health     buPROPion (WELLBUTRIN XL) 150 MG 24 hr tablet Take 1 tablet  (150 mg total) by mouth daily. For depression 30 tablet 6   cetirizine (ZYRTEC) 10 MG tablet Take 1 tablet (10 mg total) by mouth daily. For seasonal allergy and itching 30 tablet 11   DULoxetine (CYMBALTA) 60 MG capsule Take 1 capsule (60 mg total) daily by mouth. 30 capsule 6   metFORMIN (GLUCOPHAGE) 1000 MG tablet Take 1 tablet (1,000 mg total) 2 (two) times daily with a meal by mouth. 60 tablet 3   Multiple Vitamin (MULTIVITAMIN WITH MINERALS) TABS tablet Take 1 tablet by mouth daily. For low vitamin     atenolol (TENORMIN) 50 MG tablet Take 1 tablet (50 mg total) by mouth 2 (two) times daily. 180 tablet 3   No current facility-administered medications for this visit.    Allergies:   Patient has no known allergies.    Social History:  The patient  reports that she has never smoked. She has never used smokeless tobacco. She reports that she does not drink alcohol and does not use drugs.   Family History:  The patient's family history includes Alcohol abuse in her father; Anxiety disorder in her brother; Breast cancer in her cousin; Depression in her brother; Diabetes in her sister; Drug abuse in her cousin; Heart disease in her father; Hypertension in her  father and mother; Stroke in her father.    ROS:  Please see the history of present illness.   Otherwise, review of systems are positive for none.   All other systems are reviewed and negative.    PHYSICAL EXAM: VS:  BP (!) 158/92   Pulse (!) 102   Ht 5' 2"$  (1.575 m)   Wt 211 lb 3.2 oz (95.8 kg)   SpO2 97%   BMI 38.63 kg/m  , BMI Body mass index is 38.63 kg/m. GENERAL:  Well appearing HEENT:  Pupils equal round and reactive, fundi not visualized, oral mucosa unremarkable NECK:  No jugular venous distention, waveform within normal limits, carotid upstroke brisk and symmetric, no bruits, no thyromegaly LYMPHATICS:  No cervical, inguinal adenopathy LUNGS:  Clear to auscultation bilaterally BACK:  No CVA tenderness CHEST:   Unremarkable HEART:  PMI not displaced or sustained,S1 and S2 within normal limits, no S3, positive S4, no clicks, no rubs, no murmurs ABD:  Flat, positive bowel sounds normal in frequency in pitch, no bruits, no rebound, no guarding, no midline pulsatile mass, no hepatomegaly, no splenomegaly EXT:  2 plus pulses throughout, no edema, no cyanosis no clubbing SKIN:  No rashes no nodules NEURO:  Cranial nerves II through XII grossly intact, motor grossly intact throughout PSYCH:  Cognitively intact, oriented to person place and time    EKG:  EKG is ordered today. The ekg ordered today demonstrates sinus rhythm, rate 102, low voltage, no acute ST-T wave changes.   Recent Labs: No results found for requested labs within last 365 days.    Lipid Panel    Component Value Date/Time   CHOL 158 02/15/2017 0907   CHOL 154 09/28/2015 0953   TRIG 85 02/15/2017 0907   HDL 56 02/15/2017 0907   HDL 51 09/28/2015 0953   CHOLHDL 2.8 02/15/2017 0907   VLDL 19 03/23/2016 0901   LDLCALC 84 02/15/2017 0907      Wt Readings from Last 3 Encounters:  06/24/22 211 lb 3.2 oz (95.8 kg)  05/19/17 225 lb 3.2 oz (102.2 kg)  03/17/17 229 lb 9.6 oz (104.1 kg)      Other studies Reviewed: Additional studies/ records that were reviewed today include: Labs. Review of the above records demonstrates:  Please see elsewhere in the note.     ASSESSMENT AND PLAN:  SOB: I will repeat an echocardiogram.  I will check a BNP level.  Of note with her low voltage and hypertension I will keep amyloidosis in mind.  This may be multifactorial.  She will get her lung CT to look pulmonary nodule.  Might need pulmonary workup.  She will also likely need ischemia evaluation but will start with the echo.  DM: A1c was 13. 1.  She is now having this managed by her primary providers.  HTN: I am going to increase her atenolol to 50 mg twice daily.  PULM NODULE: We made sure that this test which they have had trouble  scheduling is scheduled and she is aware.  She will follow-up with her primary providers.   Current medicines are reviewed at length with the patient today.  The patient does not have concerns regarding medicines.  The following changes have been made:  no change  Labs/ tests ordered today include:   Orders Placed This Encounter  Procedures   Pro b natriuretic peptide (BNP)[id number])   EKG 12-Lead   ECHOCARDIOGRAM COMPLETE     Disposition:   FU with APP  in two months.     Signed, Minus Breeding, MD  06/24/2022 10:02 AM    [place]

## 2022-06-24 ENCOUNTER — Encounter: Payer: Self-pay | Admitting: Cardiology

## 2022-06-24 ENCOUNTER — Ambulatory Visit: Payer: 59 | Attending: Cardiology | Admitting: Cardiology

## 2022-06-24 VITALS — BP 158/92 | HR 102 | Ht 62.0 in | Wt 211.2 lb

## 2022-06-24 DIAGNOSIS — R002 Palpitations: Secondary | ICD-10-CM

## 2022-06-24 DIAGNOSIS — I517 Cardiomegaly: Secondary | ICD-10-CM

## 2022-06-24 DIAGNOSIS — M7989 Other specified soft tissue disorders: Secondary | ICD-10-CM

## 2022-06-24 DIAGNOSIS — R600 Localized edema: Secondary | ICD-10-CM | POA: Diagnosis not present

## 2022-06-24 MED ORDER — ATENOLOL 50 MG PO TABS: 50.0000 mg | ORAL_TABLET | Freq: Two times a day (BID) | ORAL | 3 refills | Status: DC

## 2022-06-24 NOTE — Patient Instructions (Signed)
Medication Instructions:   INCREASE ATENOLOL TO 50 MG TWICE DAILY  *If you need a refill on your cardiac medications before your next appointment, please call your pharmacy*   Testing/Procedures:  Your physician has requested that you have an echocardiogram. Echocardiography is a painless test that uses sound waves to create images of your heart. It provides your doctor with information about the size and shape of your heart and how well your heart's chambers and valves are working. This procedure takes approximately one hour. There are no restrictions for this procedure. Please do NOT wear cologne, perfume, aftershave, or lotions (deodorant is allowed). Please arrive 15 minutes prior to your appointment time. [place], you and your health needs are our priority.  As part of our continuing mission to provide you with exceptional heart care, we have created designated Provider Care Teams.  These Care Teams include your primary Cardiologist (physician) and Advanced Practice Providers (APPs -  Physician Assistants and Nurse Practitioners) who all work together to provide you with the care you need, when you need it.  We recommend signing up for the patient portal called "MyChart".  Sign up information is provided on this After Visit Summary.  MyChart is used to connect with patients for Virtual Visits (Telemedicine).  Patients are able to view lab/test results, encounter notes, upcoming appointments, etc.  Non-urgent messages can be sent to your provider as well.   To learn more about what you can do with MyChart, go to [email or web address].    Your next appointment:   2 month(s)  Provider:   ANY APP

## 2022-06-25 LAB — PRO B NATRIURETIC PEPTIDE: NT-Pro BNP: <36 pg/mL (ref 0–287)

## 2022-06-27 ENCOUNTER — Encounter: Payer: Self-pay | Admitting: *Deleted

## 2022-07-01 DIAGNOSIS — I10 Essential (primary) hypertension: Secondary | ICD-10-CM | POA: Diagnosis not present

## 2022-07-08 DIAGNOSIS — R1310 Dysphagia, unspecified: Secondary | ICD-10-CM | POA: Diagnosis not present

## 2022-07-08 DIAGNOSIS — I10 Essential (primary) hypertension: Secondary | ICD-10-CM | POA: Diagnosis not present

## 2022-07-08 DIAGNOSIS — E1165 Type 2 diabetes mellitus with hyperglycemia: Secondary | ICD-10-CM | POA: Diagnosis not present

## 2022-07-15 DIAGNOSIS — I10 Essential (primary) hypertension: Secondary | ICD-10-CM | POA: Diagnosis not present

## 2022-07-21 ENCOUNTER — Encounter: Payer: Self-pay | Admitting: Physician Assistant

## 2022-07-29 ENCOUNTER — Ambulatory Visit (HOSPITAL_COMMUNITY): Payer: 59 | Attending: Cardiovascular Disease

## 2022-07-29 DIAGNOSIS — I517 Cardiomegaly: Secondary | ICD-10-CM | POA: Insufficient documentation

## 2022-07-29 LAB — ECHOCARDIOGRAM COMPLETE
Area-P 1/2: 5.58 cm2
P 1/2 time: 273 msec
S' Lateral: 2.85 cm

## 2022-08-11 ENCOUNTER — Other Ambulatory Visit: Payer: Self-pay

## 2022-08-25 ENCOUNTER — Encounter: Payer: Self-pay | Admitting: *Deleted

## 2022-08-26 ENCOUNTER — Ambulatory Visit: Payer: 59 | Admitting: General Practice

## 2022-09-01 ENCOUNTER — Ambulatory Visit (INDEPENDENT_AMBULATORY_CARE_PROVIDER_SITE_OTHER): Payer: 59 | Admitting: Physician Assistant

## 2022-09-01 ENCOUNTER — Encounter: Payer: Self-pay | Admitting: Physician Assistant

## 2022-09-01 VITALS — BP 140/88 | HR 85 | Ht 62.0 in | Wt 205.0 lb

## 2022-09-01 DIAGNOSIS — R1310 Dysphagia, unspecified: Secondary | ICD-10-CM | POA: Diagnosis not present

## 2022-09-01 DIAGNOSIS — Z86010 Personal history of colonic polyps: Secondary | ICD-10-CM

## 2022-09-01 DIAGNOSIS — K219 Gastro-esophageal reflux disease without esophagitis: Secondary | ICD-10-CM

## 2022-09-01 MED ORDER — NA SULFATE-K SULFATE-MG SULF 17.5-3.13-1.6 GM/177ML PO SOLN: ORAL | 0 refills | Status: AC

## 2022-09-01 MED ORDER — OMEPRAZOLE 40 MG PO CPDR: 40.0000 mg | DELAYED_RELEASE_CAPSULE | Freq: Two times a day (BID) | ORAL | 1 refills | Status: DC

## 2022-09-01 NOTE — Progress Notes (Signed)
Chief Complaint: "Burning in my throat", history of adenomatous polyps  HPI:    Lauren Mccormick is a 57 year old female with a past medical history of diabetes and others listed below, known to Dr. Rhea Belton, who was referred to me by Kirt Boys, DO for a complaint of burning in her throat and history of adenomatous polyps.      06/04/2015 screening colonoscopy with a sessile polyp in the transverse colon and mild diverticulosis in the sigmoid and descending colon.  Pathology showed tubular adenoma.  Repeat recommended in 5 years.    07/08/2022 patient seen by PCP and at that time discussed some dysphagia.  Noted that over the past 8 years she had times there is a soreness in her lower throat associated with hoarseness and occasional dysphagia.  Sometimes food like it gets stuck.  Noted a history of reflux.    07/29/2022 echo done for left ventricular hypertrophy and shortness of breath with LVEF 60-65% and left ventricular diastolic dysfunction.  She was told to have a CT of her chest.    Today, patient presents to clinic and tells me that over the past couple of years she has noticed a constant burning in her throat, sometimes resulting in a "tightness on the left side".  She always feels like there is something in there and feels like she is having some hoarseness due to this.  Apparently they tried her on "medicine" years ago but she did not feel like it helped.  Currently not on anything for reflux or heartburn.    Also aware she is due for colonoscopy.    As far as shortness of breath which is being worked up patient tells me this is not an issue for her anymore.    Denies fever, chills, weight loss, blood in her stool, nausea, vomiting or symptoms that awaken her from sleep.  Past Medical History:  Diagnosis Date   Alopecia    Anemia    Depression    Diabetes mellitus, type II    Hypertension    Tachycardia 05/09/2010   Periodic problems with tachycardia with undetermined cause   Tubular  adenoma of colon     Past Surgical History:  Procedure Laterality Date   Bean in ears     childhood    Current Outpatient Medications  Medication Sig Dispense Refill   aspirin 81 MG chewable tablet Chew 1 tablet (81 mg total) by mouth daily. For heart health     atenolol (TENORMIN) 50 MG tablet Take 1 tablet (50 mg total) by mouth 2 (two) times daily. 180 tablet 3   buPROPion (WELLBUTRIN XL) 150 MG 24 hr tablet Take 1 tablet (150 mg total) by mouth daily. For depression 30 tablet 6   cetirizine (ZYRTEC) 10 MG tablet Take 1 tablet (10 mg total) by mouth daily. For seasonal allergy and itching 30 tablet 11   DULoxetine (CYMBALTA) 60 MG capsule Take 1 capsule (60 mg total) daily by mouth. 30 capsule 6   metFORMIN (GLUCOPHAGE) 1000 MG tablet Take 1 tablet (1,000 mg total) 2 (two) times daily with a meal by mouth. 60 tablet 3   Multiple Vitamin (MULTIVITAMIN WITH MINERALS) TABS tablet Take 1 tablet by mouth daily. For low vitamin     No current facility-administered medications for this visit.    Allergies as of 09/01/2022   (No Known Allergies)    Family History  Problem Relation Age of Onset   Hypertension Mother    Heart disease Father  No details   Hypertension Father    Stroke Father    Alcohol abuse Father    Diabetes Sister    Anxiety disorder Brother    Depression Brother    Drug abuse Cousin    Breast cancer Cousin    Colon polyps Neg Hx     Social History   Socioeconomic History   Marital status: Single    Spouse name: Not on file   Number of children: Not on file   Years of education: Not on file   Highest education level: Not on file  Occupational History   Not on file  Tobacco Use   Smoking status: Never   Smokeless tobacco: Never  Vaping Use   Vaping Use: Never used  Substance and Sexual Activity   Alcohol use: No    Alcohol/week: 0.0 standard drinks of alcohol   Drug use: No   Sexual activity: Not Currently  Other Topics Concern   Not on  file  Social History Narrative   Diet:    Do you drink/eat things with caffeine? Yes   Marital status: No                              What year were you married?    Do you live in a house, apartment, assisted living, condo, trailer, etc)? Apartment   Is it one or more stories?    How many persons live in your home?    Do you have any pets in your home? Dog   Current or past profession:    Do you exercise?    No                                                Type & how often:   Do you have a living will? No   Do you have a DNR Form?  No                                                                                                                   Do you have a POA/HPOA forms? No   Social Determinants of Corporate investment banker Strain: Not on file  Food Insecurity: Not on file  Transportation Needs: Not on file  Physical Activity: Not on file  Stress: Not on file  Social Connections: Not on file  Intimate Partner Violence: Not on file    Review of Systems:    Constitutional: No weight loss, fever or chills Skin: No rash  Cardiovascular: No chest pain Respiratory: No SOB  Gastrointestinal: See HPI and otherwise negative Genitourinary: No dysuria Neurological: No headache, dizziness or syncope Musculoskeletal: No new muscle or joint pain Hematologic: No bleeding Psychiatric: No history of depression or anxiety   Physical Exam:  Vital signs: BP (!) 140/88  Pulse 85   Ht  (1.575 m)   Wt 205 lb (93 kg)   BMI 37.49 kg/m    Constitutional:   Pleasant overweight AA female appears to be in NAD, Well developed, Well nourished, alert and cooperative Head:  Normocephalic and atraumatic. Eyes:   PEERL, EOMI. No icterus. Conjunctiva pink. Ears:  Normal auditory acuity. Neck:  Supple Throat: Oral cavity and pharynx without inflammation, swelling or lesion.  Respiratory: Respirations even and unlabored. Lungs clear to auscultation bilaterally.   No wheezes, crackles, or  rhonchi.  Cardiovascular: Normal S1, S2. No MRG. Regular rate and rhythm. No peripheral edema, cyanosis or pallor.  Gastrointestinal:  Soft, nondistended, nontender. No rebound or guarding. Normal bowel sounds. No appreciable masses or hepatomegaly. Rectal:  Not performed.  Msk:  Symmetrical without gross deformities. Without edema, no deformity or joint abnormality.  Neurologic:  Alert and  oriented x4;  grossly normal neurologically.  Skin:   Dry and intact without significant lesions or rashes. Psychiatric: Demonstrates good judgement and reason without abnormal affect or behaviors.  See HPI for recent imaging.  Assessment: 1.  GERD: Describes a "burning" in her throat as well as a globus sensation; likely GERD +/- esophagitis 2.  Globus sensation 3.  History of adenomatous polyps: Last colonoscopy in 2017 with repeat recommended in 5 years, patient overdue  Plan: 1.  Scheduled patient for diagnostic EGD with possible dilation and surveillance colonoscopy in the [facility] with Dr. Rhea Belton.  Offered her a sooner appointment with another physician but she wanted to wait.  Did provide the patient a detailed list of risks for the procedures and she agrees to proceed. Patient is appropriate for endoscopic procedure(s) in the ambulatory ([facility]) setting.  2.  Started the patient on Omeprazole 40 mg twice daily, 30-60 minutes before breakfast and dinner #60 with 5 refills 3.  Discussed current workup for shortness of breath.  She is no longer short of breath and recent echo was normal. 4.  Patient to follow in clinic per recommendations from Dr. Rhea Belton after time of procedures.  Hyacinth Meeker, PA-C [facility] 09/01/2022, 9:41 AM  Cc: Kirt Boys, DO

## 2022-09-01 NOTE — Patient Instructions (Addendum)
You have been scheduled for an endoscopy and colonoscopy. You are also scheduled for a telephone previsit on 11/07/22 at 9:00 am.  _______________________________________________________  We have sent the following medications to your pharmacy for you to pick up at your convenience: Omperazole 40 mg twice daily _______________________________________________________  If your blood pressure at your visit was 140/90 or greater, please contact your primary care physician to follow up on this.  _______________________________________________________  If you are age 61 or older, your body mass index should be between 23-30. Your Body mass index is 37.49 kg/m. If this is out of the aforementioned range listed, please consider follow up with your Primary Care Provider.  If you are age 20 or younger, your body mass index should be between 19-25. Your Body mass index is 37.49 kg/m. If this is out of the aformentioned range listed, please consider follow up with your Primary Care Provider.   ________________________________________________________  The [facility] providers would like to encourage you to use [facility] to communicate with providers for non-urgent requests or questions.  Due to long hold times on the telephone, sending your provider a message by [facility] may be a faster and more efficient way to get a response.  Please allow 48 business hours for a response.  Please remember that this is for non-urgent requests.  _______________________________________________________  Due to recent changes in healthcare laws, you may see the results of your imaging and laboratory studies on MyChart before your provider has had a chance to review them.  We understand that in some cases there may be results that are confusing or concerning to you. Not all laboratory results come back in the same time frame and the provider may be waiting for multiple results in order to interpret others.  Please give [place] 48 hours  in order for your provider to thoroughly review all the results before contacting the office for clarification of your results.

## 2022-09-02 NOTE — Progress Notes (Signed)
Addendum: Reviewed and agree with assessment and management plan. Najee Manninen M, MD  

## 2022-09-16 ENCOUNTER — Ambulatory Visit
Admission: RE | Admit: 2022-09-16 | Discharge: 2022-09-16 | Disposition: A | Discharge status: Home / Self Care | Payer: 59 | Source: Ambulatory Visit | Attending: Family Medicine | Admitting: Family Medicine

## 2022-09-16 DIAGNOSIS — R0602 Shortness of breath: Secondary | ICD-10-CM | POA: Diagnosis not present

## 2022-09-16 DIAGNOSIS — R911 Solitary pulmonary nodule: Secondary | ICD-10-CM

## 2022-09-16 DIAGNOSIS — R059 Cough, unspecified: Secondary | ICD-10-CM | POA: Diagnosis not present

## 2022-09-20 ENCOUNTER — Other Ambulatory Visit: Payer: 59

## 2022-09-30 ENCOUNTER — Ambulatory Visit: Payer: 59 | Admitting: Cardiology

## 2022-10-24 ENCOUNTER — Encounter: Payer: 59 | Admitting: Internal Medicine

## 2022-11-04 ENCOUNTER — Encounter: Payer: Self-pay | Admitting: *Deleted

## 2022-11-04 DIAGNOSIS — E1165 Type 2 diabetes mellitus with hyperglycemia: Secondary | ICD-10-CM | POA: Diagnosis not present

## 2022-11-04 DIAGNOSIS — I10 Essential (primary) hypertension: Secondary | ICD-10-CM | POA: Diagnosis not present

## 2022-11-07 ENCOUNTER — Telehealth: Payer: Self-pay

## 2022-11-07 NOTE — Telephone Encounter (Signed)
Mutiple attempts made to complete PV. Unable to reach patient. VM left. Pt to call the office back by 5 PM to have PV rescheduled. Pt made aware that in the event that we do not hear back by end of day. PV and scheduled procedure will be cancelled.

## 2022-11-28 ENCOUNTER — Other Ambulatory Visit: Payer: Self-pay | Admitting: Physician Assistant

## 2022-11-29 ENCOUNTER — Encounter: Payer: 59 | Admitting: Internal Medicine

## 2022-11-29 ENCOUNTER — Telehealth: Payer: Self-pay | Admitting: Internal Medicine

## 2022-11-29 NOTE — Telephone Encounter (Signed)
Good Morning Dr. Margretta Sidle,  I called this patient at 10:00 am  today to see if she was coming in for her procedure.   She stated that she understood someone was going to call her back to go over prep.  And no one ever called her. However, it seems that she was confused with msg left by [facility] nurse.    The PV nurse called on 11-07-2022 and tried to contact patient however they did not have any success, so she left a message. The message left stated if she did not call back They would cancel appointment.  Please Advise on status of this appointment.

## 2022-11-29 NOTE — Telephone Encounter (Signed)
Patient was scheduled for upper and lower endoscopy and I was anticipating these to be done today at 1030 This was scheduled from the office in April I am not sure where the breakdown was in the process  I would recommend she be rescheduled for these procedures  Thank you

## 2022-11-30 ENCOUNTER — Telehealth: Payer: Self-pay | Admitting: *Deleted

## 2022-11-30 NOTE — Telephone Encounter (Signed)
Per Dr. Rhea Belton, called patient to reschedule Endo/Colon and pre-vist with nurse. Left message letting patient know that we were calling to reschedule her double and PV appointments and left her the scheduling number to call back to reschedule both.

## 2022-11-30 NOTE — Telephone Encounter (Signed)
Attempt to reach pt fto reschedule her procedure to endo/colon and pre-visit appt. LM with call back #. Will try other # listed in her profile. Attempted another home # that was listed in pt's profile [phone]  was informed that that was a wrong #. Number removed from profile.

## 2022-11-30 NOTE — Telephone Encounter (Signed)
Attempted to reach pt's sister with # listed in profile and HIPAA approval to call listed. No answer after 10 rings.

## 2022-12-19 ENCOUNTER — Ambulatory Visit: Payer: 59 | Admitting: Family Medicine

## 2023-01-10 DIAGNOSIS — I10 Essential (primary) hypertension: Secondary | ICD-10-CM | POA: Diagnosis not present

## 2023-02-27 DIAGNOSIS — I10 Essential (primary) hypertension: Secondary | ICD-10-CM | POA: Diagnosis not present

## 2023-02-27 DIAGNOSIS — E7800 Pure hypercholesterolemia, unspecified: Secondary | ICD-10-CM | POA: Diagnosis not present

## 2023-02-27 DIAGNOSIS — E1165 Type 2 diabetes mellitus with hyperglycemia: Secondary | ICD-10-CM | POA: Diagnosis not present

## 2023-03-21 ENCOUNTER — Other Ambulatory Visit: Payer: Self-pay | Admitting: Cardiology

## 2023-04-26 DIAGNOSIS — I10 Essential (primary) hypertension: Secondary | ICD-10-CM | POA: Diagnosis not present

## 2023-04-26 DIAGNOSIS — R0609 Other forms of dyspnea: Secondary | ICD-10-CM | POA: Diagnosis not present

## 2023-04-26 DIAGNOSIS — Z91199 Patient's noncompliance with other medical treatment and regimen due to unspecified reason: Secondary | ICD-10-CM | POA: Diagnosis not present

## 2023-05-29 ENCOUNTER — Other Ambulatory Visit: Payer: Self-pay | Admitting: Physician Assistant

## 2024-04-16 ENCOUNTER — Other Ambulatory Visit: Payer: Self-pay | Admitting: Cardiology
